# Patient Record
Sex: Female | Born: 1975 | Race: Black or African American | Hispanic: No | Marital: Married | Smoking: Former smoker
Health system: Southern US, Community
[De-identification: ages and names within clinical notes are randomized; demographics above are authoritative.]

## PROBLEM LIST (undated history)

## (undated) DIAGNOSIS — D259 Leiomyoma of uterus, unspecified: Secondary | ICD-10-CM

## (undated) DIAGNOSIS — E7800 Pure hypercholesterolemia, unspecified: Secondary | ICD-10-CM

## (undated) DIAGNOSIS — K219 Gastro-esophageal reflux disease without esophagitis: Secondary | ICD-10-CM

## (undated) DIAGNOSIS — E119 Type 2 diabetes mellitus without complications: Secondary | ICD-10-CM

## (undated) DIAGNOSIS — I10 Essential (primary) hypertension: Secondary | ICD-10-CM

---

## 1999-11-04 ENCOUNTER — Emergency Department (HOSPITAL_COMMUNITY): Admission: EM | Admit: 1999-11-04 | Discharge: 1999-11-04 | Payer: Self-pay | Admitting: Emergency Medicine

## 2013-04-15 ENCOUNTER — Encounter (HOSPITAL_BASED_OUTPATIENT_CLINIC_OR_DEPARTMENT_OTHER): Payer: Self-pay | Admitting: Emergency Medicine

## 2013-04-15 ENCOUNTER — Emergency Department (HOSPITAL_BASED_OUTPATIENT_CLINIC_OR_DEPARTMENT_OTHER): Payer: Self-pay

## 2013-04-15 ENCOUNTER — Emergency Department (HOSPITAL_BASED_OUTPATIENT_CLINIC_OR_DEPARTMENT_OTHER)
Admission: EM | Admit: 2013-04-15 | Discharge: 2013-04-15 | Disposition: A | Discharge status: Home / Self Care | Payer: Self-pay | Attending: Emergency Medicine | Admitting: Emergency Medicine

## 2013-04-15 DIAGNOSIS — E7800 Pure hypercholesterolemia, unspecified: Secondary | ICD-10-CM | POA: Insufficient documentation

## 2013-04-15 DIAGNOSIS — E119 Type 2 diabetes mellitus without complications: Secondary | ICD-10-CM | POA: Insufficient documentation

## 2013-04-15 DIAGNOSIS — Z3202 Encounter for pregnancy test, result negative: Secondary | ICD-10-CM | POA: Insufficient documentation

## 2013-04-15 DIAGNOSIS — R109 Unspecified abdominal pain: Secondary | ICD-10-CM | POA: Insufficient documentation

## 2013-04-15 DIAGNOSIS — D649 Anemia, unspecified: Secondary | ICD-10-CM | POA: Insufficient documentation

## 2013-04-15 DIAGNOSIS — R739 Hyperglycemia, unspecified: Secondary | ICD-10-CM

## 2013-04-15 DIAGNOSIS — Z8719 Personal history of other diseases of the digestive system: Secondary | ICD-10-CM | POA: Insufficient documentation

## 2013-04-15 DIAGNOSIS — Z87891 Personal history of nicotine dependence: Secondary | ICD-10-CM | POA: Insufficient documentation

## 2013-04-15 DIAGNOSIS — Z79899 Other long term (current) drug therapy: Secondary | ICD-10-CM | POA: Insufficient documentation

## 2013-04-15 DIAGNOSIS — I10 Essential (primary) hypertension: Secondary | ICD-10-CM | POA: Insufficient documentation

## 2013-04-15 HISTORY — DX: Type 2 diabetes mellitus without complications: E11.9

## 2013-04-15 HISTORY — DX: Pure hypercholesterolemia, unspecified: E78.00

## 2013-04-15 HISTORY — DX: Gastro-esophageal reflux disease without esophagitis: K21.9

## 2013-04-15 HISTORY — DX: Essential (primary) hypertension: I10

## 2013-04-15 LAB — URINE MICROSCOPIC-ADD ON

## 2013-04-15 LAB — PREGNANCY, URINE: Preg Test, Ur: NEGATIVE

## 2013-04-15 LAB — CBC WITH DIFFERENTIAL/PLATELET
BASOS ABS: 0 10*3/uL (ref 0.0–0.1)
BASOS PCT: 0 % (ref 0–1)
Eosinophils Absolute: 0.6 10*3/uL (ref 0.0–0.7)
Eosinophils Relative: 6 % — ABNORMAL HIGH (ref 0–5)
HCT: 34.1 % — ABNORMAL LOW (ref 36.0–46.0)
Hemoglobin: 11 g/dL — ABNORMAL LOW (ref 12.0–15.0)
Lymphocytes Relative: 39 % (ref 12–46)
Lymphs Abs: 3.6 10*3/uL (ref 0.7–4.0)
MCH: 23.3 pg — ABNORMAL LOW (ref 26.0–34.0)
MCHC: 32.3 g/dL (ref 30.0–36.0)
MCV: 72.2 fL — ABNORMAL LOW (ref 78.0–100.0)
Monocytes Absolute: 0.7 10*3/uL (ref 0.1–1.0)
Monocytes Relative: 7 % (ref 3–12)
Neutro Abs: 4.4 10*3/uL (ref 1.7–7.7)
Neutrophils Relative %: 48 % (ref 43–77)
Platelets: 283 10*3/uL (ref 150–400)
RBC: 4.72 MIL/uL (ref 3.87–5.11)
RDW: 15.4 % (ref 11.5–15.5)
WBC: 9.2 10*3/uL (ref 4.0–10.5)

## 2013-04-15 LAB — BASIC METABOLIC PANEL
BUN: 17 mg/dL (ref 6–23)
CHLORIDE: 100 meq/L (ref 96–112)
CO2: 23 mEq/L (ref 19–32)
Calcium: 9 mg/dL (ref 8.4–10.5)
Creatinine, Ser: 0.7 mg/dL (ref 0.50–1.10)
GFR calc Af Amer: >90 mL/min (ref >90)
GFR calc non Af Amer: >90 mL/min (ref >90)
Glucose, Bld: 245 mg/dL — ABNORMAL HIGH (ref 70–99)
Potassium: 4.3 mEq/L (ref 3.7–5.3)
SODIUM: 137 meq/L (ref 137–147)

## 2013-04-15 LAB — GLUCOSE, CAPILLARY: Glucose-Capillary: 223 mg/dL — ABNORMAL HIGH (ref 70–99)

## 2013-04-15 LAB — URINALYSIS, ROUTINE W REFLEX MICROSCOPIC
Bilirubin Urine: NEGATIVE
GLUCOSE, UA: 500 mg/dL — AB
Ketones, ur: NEGATIVE mg/dL
LEUKOCYTES UA: NEGATIVE
Nitrite: NEGATIVE
PROTEIN: NEGATIVE mg/dL
SPECIFIC GRAVITY, URINE: 1.039 — AB (ref 1.005–1.030)
Urobilinogen, UA: 1 mg/dL (ref 0.0–1.0)
pH: 5.5 (ref 5.0–8.0)

## 2013-04-15 MED ORDER — GLIPIZIDE 10 MG PO TABS: 10.0000 mg | ORAL_TABLET | Freq: Every day | ORAL | Status: DC

## 2013-04-15 MED ORDER — OXYCODONE-ACETAMINOPHEN 5-325 MG PO TABS
2.0000 | ORAL_TABLET | Freq: Once | ORAL | Status: AC
Start: 2013-04-15 — End: 2013-04-15
  Administered 2013-04-15: 2 via ORAL
  Filled 2013-04-15: qty 2

## 2013-04-15 MED ORDER — ATORVASTATIN CALCIUM 10 MG PO TABS: 10.0000 mg | ORAL_TABLET | Freq: Every day | ORAL | Status: DC

## 2013-04-15 MED ORDER — METFORMIN HCL 1000 MG PO TABS: 1000.0000 mg | ORAL_TABLET | Freq: Two times a day (BID) | ORAL | Status: DC

## 2013-04-15 MED ORDER — NAPROXEN 500 MG PO TABS: 500.0000 mg | ORAL_TABLET | Freq: Two times a day (BID) | ORAL | Status: DC

## 2013-04-15 MED ORDER — LISINOPRIL 10 MG PO TABS: 10.0000 mg | ORAL_TABLET | Freq: Every day | ORAL | Status: DC

## 2013-04-15 NOTE — ED Provider Notes (Signed)
CSN: [id number]     Arrival date & time 04/15/13  [id number] History   First MD Initiated Contact with Patient 04/15/13 [phone]     Chief Complaint  Patient presents with  . Flank Pain   (Consider location/radiation/quality/duration/timing/severity/associated sxs/prior Treatment) HPI Comments: Pt is a 38 y/o female with hx of DM and Htn (not taking meds in > month), presents with L sided LUQ and flank pain that radiates into the groin and lower abd.  This has been going on for > one month - is intermittent - comes on spontaneously, not positional, no urinary or stool symtpoms or complaints.  She has not had any post prandial pain - the pain is short lived and described as two bricks rubbing together.  It will then resolve spontaneously.  No prior abd surgery.  Patient is a 38 y.o. female presenting with flank pain. The history is provided by the patient.  Flank Pain    Past Medical History  Diagnosis Date  . Hypertension   . Diabetes mellitus without complication   . Hypercholesteremia   . GERD (gastroesophageal reflux disease)    History reviewed. No pertinent past surgical history. History reviewed. No pertinent family history. History  Substance Use Topics  . Smoking status: Former Smoker    Quit date: 04/14/2013  . Smokeless tobacco: Not on file  . Alcohol Use: No   OB History   Grav Para Term Preterm Abortions TAB SAB Ect Mult Living                 Review of Systems  Genitourinary: Positive for flank pain.  All other systems reviewed and are negative.    Allergies  Bactrim  Home Medications   Current Outpatient Rx  Name  Route  Sig  Dispense  Refill  . atorvastatin (LIPITOR) 10 MG tablet   Oral   Take 10 mg by mouth daily.         Marland Kitchen glipiZIDE (GLUCOTROL) 10 MG tablet   Oral   Take 1 tablet (10 mg total) by mouth daily before breakfast.   30 tablet   2   . lisinopril (PRINIVIL,ZESTRIL) 10 MG tablet   Oral   Take 1 tablet (10 mg total) by mouth daily.   30  tablet   2   . metFORMIN (GLUCOPHAGE) 1000 MG tablet   Oral   Take 1 tablet (1,000 mg total) by mouth 2 (two) times daily with a meal.   60 tablet   2   . naproxen (NAPROSYN) 500 MG tablet   Oral   Take 1 tablet (500 mg total) by mouth 2 (two) times daily with a meal.   30 tablet   0    BP 157/98  Pulse 90  Temp(Src) 98.2 F (36.8 C) (Oral)  Resp 20  Ht 5\' 7"  (1.702 m)  Wt 215 lb (97.523 kg)  BMI 33.67 kg/m2  SpO2 99%  LMP 04/15/2013 Physical Exam  Nursing note and vitals reviewed. Constitutional: She appears well-developed and well-nourished. No distress.  HENT:  Head: Normocephalic and atraumatic.  Mouth/Throat: Oropharynx is clear and moist. No oropharyngeal exudate.  Eyes: Conjunctivae and EOM are normal. Pupils are equal, round, and reactive to light. Right eye exhibits no discharge. Left eye exhibits no discharge. No scleral icterus.  Neck: Normal range of motion. Neck supple. No JVD present. No thyromegaly present.  Cardiovascular: Normal rate, regular rhythm, normal heart sounds and intact distal pulses.  Exam reveals no gallop and no friction rub.  No murmur heard. Pulmonary/Chest: Effort normal and breath sounds normal. No respiratory distress. She has no wheezes. She has no rales.  Abdominal: Soft. Bowel sounds are normal. She exhibits no distension and no mass. There is tenderness ( mild LuQ ttp and mild L CVA ttp.  no guarding, no R sided ttp, no RUQ or epigastric ttp).  Musculoskeletal: Normal range of motion. She exhibits no edema and no tenderness.  Lymphadenopathy:    She has no cervical adenopathy.  Neurological: She is alert. Coordination normal.  Skin: Skin is warm and dry. No rash noted. No erythema.  Psychiatric: She has a normal mood and affect. Her behavior is normal.    ED Course  Procedures (including critical care time) Labs Review Labs Reviewed  URINALYSIS, ROUTINE W REFLEX MICROSCOPIC - Abnormal; Notable for the following:    APPearance  CLOUDY (*)    Specific Gravity, Urine 1.039 (*)    Glucose, UA 500 (*)    Hgb urine dipstick LARGE (*)    All other components within normal limits  CBC WITH DIFFERENTIAL - Abnormal; Notable for the following:    Hemoglobin 11.0 (*)    HCT 34.1 (*)    MCV 72.2 (*)    MCH 23.3 (*)    Eosinophils Relative 6 (*)    All other components within normal limits  BASIC METABOLIC PANEL - Abnormal; Notable for the following:    Glucose, Bld 245 (*)    All other components within normal limits  URINE MICROSCOPIC-ADD ON - Abnormal; Notable for the following:    Squamous Epithelial / LPF FEW (*)    Bacteria, UA FEW (*)    Casts HYALINE CASTS (*)    All other components within normal limits  GLUCOSE, CAPILLARY - Abnormal; Notable for the following:    Glucose-Capillary 223 (*)    All other components within normal limits  PREGNANCY, URINE   Imaging Review Ct Abdomen Pelvis Wo Contrast  04/15/2013   CLINICAL DATA:  Left flank pain.  EXAM: CT ABDOMEN AND PELVIS WITHOUT CONTRAST  TECHNIQUE: Multidetector CT imaging of the abdomen and pelvis was performed following the standard protocol without intravenous contrast.  COMPARISON:  None.  FINDINGS: BODY WALL: Unremarkable.  LOWER CHEST: Unremarkable.  ABDOMEN/PELVIS:  Liver: Diffuse fatty infiltration of the liver. The liver is prominent in size, 18 cm in maximal craniocaudal span. Stone  Biliary: No evidence of biliary obstruction or stone.  Pancreas: Unremarkable.  Spleen: Unremarkable.  Adrenals: Unremarkable.  Kidneys and ureters: No hydronephrosis or stone.  Bladder: Unremarkable.  Reproductive: Unremarkable.  Bowel: No obstruction. Normal appendix.  Retroperitoneum: Mildly prominent periaortic lymph nodes, not definitively pathologic.  Peritoneum: No free fluid or gas.  Vascular: No acute abnormality.  OSSEOUS: Early degenerative change to the SI joints with small spurs. Mild lower lumbar facet osteoarthritis. There is disc bulging at L5-S1 which effaces  the inferior foramina.  IMPRESSION: 1. No hydronephrosis or nephrolithiasis. 2. Fatty liver.   Electronically Signed   By: Jorje Guild M.D.   On: 04/15/2013 05:29    EKG Interpretation   None       MDM   1. Abdominal pain   2. Hyperglycemia   3. Hypertension   4. Mild anemia    The pt's VS show hypertension - check CBG, UA and preg - possible KS, diverticulitis, pyelo though less likely given intermittent sx.  Pain meds ordered.  States she rarely gets nauseated or diaphoretic.  She claims that she is on her  menstrual cycle at this time.  Urinalysis shows hematuria, expected given her menstrual cycle, no signs of infection. Labs show mild anemia but no leukocytosis, normal renal function, slight hyperglycemia. The patient endorses not taking her medications as prescribed explaining her slight hypertension and hyperglycemia. No signs of DKA, CT scan shows no signs of intra-abdominal pathology of concern today. The patient was informed of all of these results and encouraged to follow very closely, she expresses her understanding and acknowledges the indications for return.  Rx:  Glipizide Metformin Lisinopril Naprosyn Lipitor  Johnna Acosta, MD 04/15/13 [phone]

## 2013-04-15 NOTE — ED Notes (Signed)
Pt reports left flank pain that radiates to her back, pt reports pain has been off/on for 2 weeks, tonight pain became the most intense she has felt

## 2013-04-15 NOTE — ED Notes (Signed)
Pt reports that she has been unable to get her glypizide, metformin, or htn meds for > 1-2 months, pt has not followed her blood sugars for months either due to recent move and lifestyle change

## 2013-04-15 NOTE — Discharge Instructions (Signed)
Your testing was normal though it did show that you have elevated blood sugar levels and high blood pressure.  Your CT scan did not show any surgical problems, kidney stones, infections, abscesses or other concerning findings.  You MUST follow up this next week for recheck.  Meds given in ED:   Please take the medicines that I have Rx inlcuding your diabetic medicine and your blood pressure medicines.   Emergency Department Resource Guide 1) Find a Doctor and Pay Out of Pocket Although you won't have to find out who is covered by your insurance plan, it is a good idea to ask around and get recommendations. You will then need to call the office and see if the doctor you have chosen will accept you as a new patient and what types of options they offer for patients who are self-pay. Some doctors offer discounts or will set up payment plans for their patients who do not have insurance, but you will need to ask so you aren't surprised when you get to your appointment.  2) Contact Your Local Health Department Not all health departments have doctors that can see patients for sick visits, but many do, so it is worth a call to see if yours does. If you don't know where your local health department is, you can check in your phone book. The CDC also has a tool to help you locate your state's health department, and many state websites also have listings of all of their local health departments.  3) Find a [facility] If your illness is not likely to be very severe or complicated, you may want to try a walk in clinic. These are popping up all over the country in pharmacies, drugstores, and shopping centers. They're usually staffed by nurse practitioners or physician assistants that have been trained to treat common illnesses and complaints. They're usually fairly quick and inexpensive. However, if you have serious medical issues or chronic medical problems, these are probably not your best option.  No  Primary Care Doctor: - Call Health Connect at  [phone] - they can help you locate a primary care doctor that  accepts your insurance, provides certain services, etc. - Physician Referral Service- [phone]  Chronic Pain Problems: Organization         Address  Phone   Notes  [facility]  [phone] Patients need to be referred by their primary care doctor.   Medication Assistance: Organization         Address  Phone   Notes  [facility] Medication [facility] [address] [place], [place] [address] [phone] --Must be a resident of [facility] -- Must have NO insurance coverage whatsoever (no Medicaid/ Medicare, etc.) -- The pt. MUST have a primary care doctor that directs their care regularly and follows them in the community   [facility]ist  [phone]   [employer]  [phone]    Agencies that provide inexpensive medical care: Organization         Address  Phone   Notes  [place]  [phone]   [facility]    [phone]   [facility] [place], [place] [address] [phone]   [place] [phone]   Planned Parenthood    [phone]   [facility]    [phone]   Community Health and  [address], [place] Phone:  [phone], Fax:  [phone] Hours of Operation:  9 am - 6 pm, M-F.  Also accepts Medicaid/Medicare and self-pay.  [facility] for [address], [address], [place] Phone: [phone], Fax: [phone]. Hours of Operation:  8:30 am - 5:30 pm, M-F.  Also accepts Medicaid and self-pay.  [facility] [place] [address] [place] Phone: [phone]   [place], [place], [place] [phone], Ext. [phone] Mondays & Thursdays: 7-9 AM.  First 15 patients are seen on  a first come, first serve basis.    [facility] Providers:  Organization         Address  Phone   Notes  [facility] [address], [place] [phone] Also accepts self-pay patients.  [facility] [address], [place]  [phone]   [place], [address], [place] [phone]   [facility] [address] [place] [phone]   Lucianne Lei [address], [place]   [phone] Only accepts [place] Access [place] patients after they have their name applied to their card.   Self-Pay (no insurance) in [facility]:  Organization         Address  Phone   Notes  Sickle Cell Patients, [facility] Internal Medicine [place] [phone]   [facility]gent Care [place] [phone]   [facility] [place]  [place], [place], [place] [phone]   [facility]/Dr. Osei-Bonsu  [address] [place] or [address], [place] [phone] Phone number for both [place] and [place] locations is the same.  Urgent Medical and [facility] [address] [place] [phone]   [facility] [address] [place] or [address] [phone]   [facility] [address] [place] [phone], phone; [phone], fax Sees patients 1st and 3rd Saturday of every month.  Must not qualify for public or private insurance (i.e. Medicaid, Medicare, [place] Health Choice, Veterans' Benefits)  Household income should be no more than 200% of the poverty level The clinic cannot treat you if you are pregnant or think you are pregnant  Sexually transmitted diseases are not treated at the clinic.    Dental Care: Organization          Address  Phone  Notes  [facility] Department of [facility] [place] [phone] Accepts children up to age 37 who are enrolled in [place] or [place]; pregnant women with a Medicaid card; and children who have applied for Medicaid or [place] Health Choice, but were declined, whose parents can pay a reduced fee at time of service.  [facility] Department of [facility]  [address], [place] [phone] Accepts children up to age 25 who are enrolled in [place] or [place]; pregnant women with a Medicaid card; and children who have applied for Medicaid or [place] Health Choice, but were declined, whose parents can pay a reduced fee at time of service.  [facility] Adult [facility] Access PROGRAM  [place],  [place] [phone] Patients are seen by appointment only. Walk-ins are not accepted. [place] will see patients 31 years of age and older. Monday - Tuesday (8am-5pm) Most Wednesdays (8:30-5pm) $30 per visit, cash only  [facility] Adult Dental Access PROGRAM  [address], [facility] [phone] Patients are seen by appointment only. Walk-ins are not accepted. [place] will see patients 57 years of age and older. One Wednesday Evening (Monthly: Volunteer Based).  $30 per visit, cash only  [place]  [phone] for adults; Children under age 86, call Graduate Pediatric Dentistry at [phone]. Children aged 33-14, please call [phone] to request a pediatric application.  Dental services are provided in all areas of dental care including fillings, crowns and bridges, complete and partial dentures, implants, gum treatment, root canals, and extractions. Preventive care is also provided. Treatment is provided to both adults and children. Patients are selected via a lottery and there is often a waiting list.   [facility] [address] [place]  [phone] [email or web address]   [facility] [address] [place], [place] [phone], Ext. [phone] Second and Fourth Thursday of each month, opens at 6:30 AM; Clinic ends at 9 AM.  Patients are seen on a first-come first-served basis, and a limited number are seen during each clinic.   [facility]  [address] Hillard Danker [place], [place] [phone]   Eligibility Requirements You must have lived in [place], [place], or [place] counties for at least the last three months.   You cannot be eligible for state or federal sponsored [employer], including [employer], [place], or [employer].   You generally cannot be eligible for healthcare insurance through your employer.    How to apply: Eligibility screenings are held every Tuesday and Wednesday afternoon from 1:00 pm until 4:00 pm. You do not need an appointment for the interview!  [facility] [address] [place], [place]   [place] Department  [place]  [phone]    Behavioral Health Resources in the Community: Intensive Outpatient Programs Organization         Address  Phone  Notes  [place]. [address] [place], [place] [phone]   [facility] Outpatient [address] [place], [place]   ADS: [facility] Drug [facility] [address] [place], [place]   [place] [address],  [place], [place] or [phone]   Substance Abuse Resources Organization         Address  Phone  Notes  Alcohol and Drug Services  [phone]   [place]  [phone]   [facility] [place]   Chinita Pester  [phone]   Residential & Outpatient Substance Abuse Program  [phone]   Psychological  Services Organization         Address  Phone  Notes  [facility] [place]  [place]  [phone]   [place] [address], [place] or [phone]    Mobile Crisis Teams Organization         Address  Phone  Notes  [facility], Mobile Crisis Care Unit  [phone]   Assertive Psychotherapeutic Services  [address] [place] [address] [place], [place]   [place] [phone]  [address] [place] [phone]    Self-Help/Support Groups Organization         Address  Phone             Notes  Mental Health Assoc. of [place] - variety of support groups  [place] Call for more information  Narcotics Anonymous (NA), [facility] [address], [employer] [place]  2 meetings at this location   Special educational needs teacher         Address  Phone  Notes  [facility] [place],    [place]  [phone]   [facility]  [address] [place] [address], [place], [place]   [place], [place] [phone] Admissions: 8am-3pm M-F  Incentives Substance [place] [address],    [place], [place] [phone]   [facility] [address] [place], [place], [place]   [facility] [address],  [place], [place]   [facility] Programs - Intensive Outpatient [address], [place], [place]   [facility] ([place].,  [place], [place] [phone] or [phone]   [facility] Treatment Services (RTS) [address] [place], [place] Accepts Medicaid  [facility] [place] [address]  [place] [phone] Substance Abuse/Addiction Treatment   [facility] Organization         Address  Phone  Notes  [facility]  [phone]   Domenic Schwab, PhD [address] Arlis Porta [place], [place]   [phone] or [phone]   [place], [place] [phone]   [facility] [address] [place], [place] [phone] Insurance/Medicaid/sponsorship through [facility] and Families [address], [address] [place]                                    [place], [place] [phone] [place] [address], [place] [phone]    Dr. Adele Schilder  [phone]   [facility] Dept. 1) [address] [place] 2) [place] 3)  [place] [address], [place] [phone]  [phone]   [place] [phone] or [phone] (After Hours)

## 2013-04-19 ENCOUNTER — Encounter: Payer: Self-pay | Admitting: Internal Medicine

## 2013-04-19 ENCOUNTER — Other Ambulatory Visit: Payer: Self-pay | Admitting: Internal Medicine

## 2013-04-19 ENCOUNTER — Ambulatory Visit (INDEPENDENT_AMBULATORY_CARE_PROVIDER_SITE_OTHER): Payer: BC Managed Care – PPO | Admitting: Internal Medicine

## 2013-04-19 VITALS — BP 137/93 | HR 85 | Resp 16 | Ht 67.0 in | Wt 237.0 lb

## 2013-04-19 DIAGNOSIS — R51 Headache: Secondary | ICD-10-CM

## 2013-04-19 DIAGNOSIS — I10 Essential (primary) hypertension: Secondary | ICD-10-CM

## 2013-04-19 DIAGNOSIS — E1165 Type 2 diabetes mellitus with hyperglycemia: Secondary | ICD-10-CM

## 2013-04-19 DIAGNOSIS — E119 Type 2 diabetes mellitus without complications: Secondary | ICD-10-CM

## 2013-04-19 DIAGNOSIS — IMO0001 Reserved for inherently not codable concepts without codable children: Secondary | ICD-10-CM

## 2013-04-19 DIAGNOSIS — R102 Pelvic and perineal pain: Secondary | ICD-10-CM

## 2013-04-19 DIAGNOSIS — N920 Excessive and frequent menstruation with regular cycle: Secondary | ICD-10-CM

## 2013-04-19 DIAGNOSIS — N949 Unspecified condition associated with female genital organs and menstrual cycle: Secondary | ICD-10-CM

## 2013-04-19 DIAGNOSIS — E785 Hyperlipidemia, unspecified: Secondary | ICD-10-CM

## 2013-04-19 DIAGNOSIS — R519 Headache, unspecified: Secondary | ICD-10-CM | POA: Insufficient documentation

## 2013-04-19 LAB — CBC WITH DIFFERENTIAL/PLATELET
BASOS ABS: 0 10*3/uL (ref 0.0–0.1)
Basophils Relative: 0 % (ref 0–1)
EOS PCT: 6 % — AB (ref 0–5)
Eosinophils Absolute: 0.4 10*3/uL (ref 0.0–0.7)
HEMATOCRIT: 35.1 % — AB (ref 36.0–46.0)
Hemoglobin: 11.3 g/dL — ABNORMAL LOW (ref 12.0–15.0)
Lymphocytes Relative: 39 % (ref 12–46)
Lymphs Abs: 2.6 10*3/uL (ref 0.7–4.0)
MCH: 23 pg — ABNORMAL LOW (ref 26.0–34.0)
MCHC: 32.2 g/dL (ref 30.0–36.0)
MCV: 71.5 fL — AB (ref 78.0–100.0)
MONO ABS: 0.4 10*3/uL (ref 0.1–1.0)
Monocytes Relative: 6 % (ref 3–12)
Neutro Abs: 3.3 10*3/uL (ref 1.7–7.7)
Neutrophils Relative %: 49 % (ref 43–77)
Platelets: 344 10*3/uL (ref 150–400)
RBC: 4.91 MIL/uL (ref 3.87–5.11)
RDW: 16.5 % — AB (ref 11.5–15.5)
WBC: 6.8 10*3/uL (ref 4.0–10.5)

## 2013-04-19 LAB — COMPREHENSIVE METABOLIC PANEL
ALT: 15 U/L (ref 0–35)
AST: 12 U/L (ref 0–37)
Albumin: 4 g/dL (ref 3.5–5.2)
Alkaline Phosphatase: 59 U/L (ref 39–117)
BUN: 13 mg/dL (ref 6–23)
CALCIUM: 9.2 mg/dL (ref 8.4–10.5)
CHLORIDE: 102 meq/L (ref 96–112)
CO2: 26 mEq/L (ref 19–32)
CREATININE: 0.68 mg/dL (ref 0.50–1.10)
Glucose, Bld: 208 mg/dL — ABNORMAL HIGH (ref 70–99)
Potassium: 4.6 mEq/L (ref 3.5–5.3)
Sodium: 138 mEq/L (ref 135–145)
Total Bilirubin: 0.2 mg/dL — ABNORMAL LOW (ref 0.3–1.2)
Total Protein: 6.7 g/dL (ref 6.0–8.3)

## 2013-04-19 LAB — LIPID PANEL
CHOL/HDL RATIO: 5.8 ratio
CHOLESTEROL: 198 mg/dL (ref 0–200)
HDL: 34 mg/dL — AB (ref >39)
TRIGLYCERIDES: 454 mg/dL — AB (ref <150)

## 2013-04-19 LAB — TSH: TSH: 0.437 u[IU]/mL (ref 0.350–4.500)

## 2013-04-19 LAB — HEMOGLOBIN A1C
Hgb A1c MFr Bld: 9 % — ABNORMAL HIGH (ref <5.7)
Mean Plasma Glucose: 212 mg/dL — ABNORMAL HIGH (ref <117)

## 2013-04-19 MED ORDER — LISINOPRIL-HYDROCHLOROTHIAZIDE 10-12.5 MG PO TABS: 1.0000 | ORAL_TABLET | Freq: Every day | ORAL | Status: DC

## 2013-04-19 MED ORDER — GLIPIZIDE 10 MG PO TABS: 10.0000 mg | ORAL_TABLET | Freq: Every day | ORAL | Status: DC

## 2013-04-19 NOTE — Patient Instructions (Signed)
referra l to GYN for bleeding and diabetes center new diabetes  See me in 2 weeks  30 min

## 2013-04-19 NOTE — Progress Notes (Signed)
   Subjective:    Patient ID: Danielle Mendez, female    DOB: [birth year], 38 y.o.   MRN: [id number]  HPI Danielle Mendez is a new pt here for primary care. Without insurance for many years.   Formerly in [place]  .  She is now working part-time at [employer] of Type II diabetes, HTN, Hyperlipidemia,    Headaches  (told in ER they were migraines).  She has been out of all meds for the past 3-4 months.  She cannot tolerate Metformin due to nausea  She has increased thirst,  No polyuria, no polyphagia .  FBS in office 260.    She also notes heavy menstrual bleeding.  She is G3P3AB0.   Lots of clots  Duration 7-8 days.   Just came of menses yesterday She has had Left lower pelvic pain for over 1 year.    No dysuria,  No N/V/D    Review of Systems See HPI    Objective:   Physical Exam Physical Exam  Nursing note and vitals reviewed.  Constitutional: She is oriented to person, place, and time. She appears well-developed and well-nourished.  HENT:  Head: Normocephalic and atraumatic.  Cardiovascular: Normal rate and regular rhythm. Exam reveals no gallop and no friction rub.  No murmur heard.  Pulmonary/Chest: Breath sounds normal. She has no wheezes. She has no rales.  Abd  BS +  Soft  No HSM  Minimal tenderness LLQ no rebound no peritoneal signs Neurological: She is alert and oriented to person, place, and time.  Skin: Skin is warm and dry.  Psychiatric: She has a normal mood and affect. Her behavior is normal.          Assessment & Plan:  Uncontrolled diabetes:   FBS 260 in office  Will restart Glipizide,  Refer diabetes education.  I gave RX for glucometer and advised to test glucose bid and bring next visit.   Reviewed S/S of hypoglycemia and how to treat.    See me in 2 weeks  HTN    Will restart Lisinopril/HCTZ 12.5    Menorrhagia   Will check CBC with TSH and complete chem panel.  Will refer to GYN for further eval and possibly ultrasound for pelvic  discomfort  Hyperlipidemia  Will check today  Chronic pelvic discomfort  See above  Headache.    See me in two weeks.

## 2013-04-20 ENCOUNTER — Ambulatory Visit: Payer: Self-pay | Admitting: Internal Medicine

## 2013-04-20 LAB — VITAMIN D 25 HYDROXY (VIT D DEFICIENCY, FRACTURES): Vit D, 25-Hydroxy: 19 ng/mL — ABNORMAL LOW (ref 30–89)

## 2013-04-21 ENCOUNTER — Encounter: Payer: Self-pay | Admitting: *Deleted

## 2013-04-22 ENCOUNTER — Encounter: Payer: Self-pay | Admitting: *Deleted

## 2013-04-25 ENCOUNTER — Telehealth: Payer: Self-pay | Admitting: *Deleted

## 2013-04-25 NOTE — Telephone Encounter (Signed)
Message copied by Conley Rolls on Mon Apr 25, 2013  9:28 AM ------      Message from: Emi Belfast D      Created: Mon Apr 25, 2013  8:00 AM       Jolayne Haines            Call pt and let her know that she is slightly anemic - will discuss further at next visit  .  Advise her to keep appt with me on the 27th ------

## 2013-04-25 NOTE — Telephone Encounter (Signed)
Notified pt of lab results. Pt will keep F/U appt on 27th

## 2013-05-03 ENCOUNTER — Encounter: Payer: Self-pay | Admitting: Internal Medicine

## 2013-05-03 ENCOUNTER — Ambulatory Visit (INDEPENDENT_AMBULATORY_CARE_PROVIDER_SITE_OTHER): Payer: BC Managed Care – PPO | Admitting: Internal Medicine

## 2013-05-03 VITALS — BP 118/74 | HR 87 | Temp 98.0°F | Ht 67.5 in | Wt 238.2 lb

## 2013-05-03 DIAGNOSIS — E785 Hyperlipidemia, unspecified: Secondary | ICD-10-CM

## 2013-05-03 DIAGNOSIS — E1165 Type 2 diabetes mellitus with hyperglycemia: Secondary | ICD-10-CM

## 2013-05-03 DIAGNOSIS — N920 Excessive and frequent menstruation with regular cycle: Secondary | ICD-10-CM

## 2013-05-03 DIAGNOSIS — IMO0001 Reserved for inherently not codable concepts without codable children: Secondary | ICD-10-CM

## 2013-05-03 DIAGNOSIS — I10 Essential (primary) hypertension: Secondary | ICD-10-CM

## 2013-05-03 DIAGNOSIS — R102 Pelvic and perineal pain unspecified side: Secondary | ICD-10-CM

## 2013-05-03 DIAGNOSIS — Z23 Encounter for immunization: Secondary | ICD-10-CM

## 2013-05-03 MED ORDER — INTEGRA 62.5-62.5-40-3 MG PO CAPS: ORAL_CAPSULE | ORAL | Status: DC

## 2013-05-03 MED ORDER — ERGOCALCIFEROL 1.25 MG (50000 UT) PO CAPS: 50000.0000 [IU] | ORAL_CAPSULE | ORAL | Status: DC

## 2013-05-03 MED ORDER — LISINOPRIL 10 MG PO TABS: 10.0000 mg | ORAL_TABLET | Freq: Every day | ORAL | Status: DC

## 2013-05-03 NOTE — Progress Notes (Signed)
Subjective:    Patient ID: Danielle Mendez, female    DOB: 13-May-1975, 38 y.o.   MRN: [id number]  HPI Danielle Mendez is here for follow up after being out of her htn and diabetes meds for several months.  She has been taking lisinopril 10 mg daily (RX from ER)  And she has been taking 10 m of glipizide and metformin 1000 mg bid.  She reports metformin is making her nauseated and she has diarrhea.   She did not bring home glucoses with her but tells me they have been running  "120-130"  She has not started diabetes education classes as yet  See lipids and vitamin D.  She would like to try something OTC for her elevated Tg's    See HGB.   She has pending appt with Dr. Leo Mendez in early February.    Allergies  Allergen Reactions  . Bactrim [Sulfamethoxazole-Tmp Ds]    Past Medical History  Diagnosis Date  . Hypertension   . Diabetes mellitus without complication   . Hypercholesteremia   . GERD (gastroesophageal reflux disease)    No past surgical history on file. History   Social History  . Marital Status: Single    Spouse Name: N/A    Number of Children: N/A  . Years of Education: N/A   Occupational History  . Not on file.   Social History Main Topics  . Smoking status: Former Smoker    Quit date: 04/14/2013  . Smokeless tobacco: Not on file  . Alcohol Use: No  . Drug Use: No  . Sexual Activity: Yes    Birth Control/ Protection: None   Other Topics Concern  . Not on file   Social History Narrative  . No narrative on file   No family history on file. Patient Active Problem List   Diagnosis Date Noted  . Type II or unspecified type diabetes mellitus without mention of complication, uncontrolled 04/19/2013  . HTN (hypertension) 04/19/2013  . Hyperlipidemia 04/19/2013  . Headache(784.0) 04/19/2013  . Menorrhagia 04/19/2013  . Pelvic pain 04/19/2013   Current Outpatient Prescriptions on File Prior to Visit  Medication Sig Dispense Refill  . albuterol (PROVENTIL  HFA;VENTOLIN HFA) 108 (90 BASE) MCG/ACT inhaler Inhale 2 puffs into the lungs every 6 (six) hours as needed for wheezing or shortness of breath.      Marland Kitchen atorvastatin (LIPITOR) 10 MG tablet Take 10 mg by mouth daily.      Marland Kitchen gabapentin (NEURONTIN) 300 MG capsule Take 300 mg by mouth 3 (three) times daily.      Marland Kitchen glipiZIDE (GLUCOTROL) 10 MG tablet Take 1 tablet (10 mg total) by mouth daily before breakfast.  90 tablet  0  . pantoprazole (PROTONIX) 20 MG tablet Take 20 mg by mouth daily.      Marland Kitchen lisinopril-hydrochlorothiazide (PRINZIDE,ZESTORETIC) 10-12.5 MG per tablet Take 1 tablet by mouth daily.  90 tablet  0   No current facility-administered medications on file prior to visit.       Review of Systems See HPI    Objective:   Physical Exam Physical Exam  Nursing note and vitals reviewed.  Constitutional: She is oriented to person, place, and time. She appears well-developed and well-nourished.  HENT:  Head: Normocephalic and atraumatic.  Cardiovascular: Normal rate and regular rhythm. Exam reveals no gallop and no friction rub.  No murmur heard.  Pulmonary/Chest: Breath sounds normal. She has no wheezes. She has no rales.  Neurological: She is alert and oriented to  person, place, and time.  Skin: Skin is warm and dry.  Psychiatric: She has a normal mood and affect. Her behavior is normal.             Assessment & Plan:  Diabetes  Will stop metformin due to GI intolerance.  Advised to take glipizide 10 mg in am and 1/2 tab at dinner.  She is to check glucose fasting and before dinner and bring to me next visit.  Diabetes education pending  Anemia  Will start Integra daily  GYN eval pending  HTN  OK to take lisinopril 10 mg only  Hypertriglyceridemia  She does not want a statin now.  Advised to take fish oil 2 gms daily  Vitamin D deficiency  RX 50,000 units weekly for 8 weeks  Flu vaccine given today  See me in March for CPE

## 2013-05-03 NOTE — Patient Instructions (Signed)
Keep next appt. With me

## 2013-05-16 ENCOUNTER — Ambulatory Visit: Payer: Self-pay | Admitting: Internal Medicine

## 2013-05-25 ENCOUNTER — Other Ambulatory Visit: Payer: Self-pay | Admitting: *Deleted

## 2013-05-25 NOTE — Telephone Encounter (Signed)
Refill request

## 2013-05-26 NOTE — Telephone Encounter (Signed)
Danielle Mendez  Pt was to see me in office on 1/27 for follow up and bring record of her blood glucose.    I did not refill med -  She is to see  Me in office  Give 30 min appt.  Bring record of glucoses with her

## 2013-05-27 ENCOUNTER — Other Ambulatory Visit: Payer: Self-pay | Admitting: *Deleted

## 2013-06-14 ENCOUNTER — Encounter: Payer: Self-pay | Admitting: Internal Medicine

## 2013-06-27 ENCOUNTER — Encounter: Payer: Self-pay | Admitting: Internal Medicine

## 2013-06-27 DIAGNOSIS — Z0000 Encounter for general adult medical examination without abnormal findings: Secondary | ICD-10-CM

## 2013-08-24 ENCOUNTER — Emergency Department (HOSPITAL_BASED_OUTPATIENT_CLINIC_OR_DEPARTMENT_OTHER)
Admission: EM | Admit: 2013-08-24 | Discharge: 2013-08-24 | Disposition: A | Discharge status: Home / Self Care | Payer: BC Managed Care – PPO | Attending: Emergency Medicine | Admitting: Emergency Medicine

## 2013-08-24 ENCOUNTER — Encounter (HOSPITAL_BASED_OUTPATIENT_CLINIC_OR_DEPARTMENT_OTHER): Payer: Self-pay | Admitting: Emergency Medicine

## 2013-08-24 ENCOUNTER — Emergency Department (HOSPITAL_BASED_OUTPATIENT_CLINIC_OR_DEPARTMENT_OTHER): Payer: BC Managed Care – PPO

## 2013-08-24 DIAGNOSIS — E785 Hyperlipidemia, unspecified: Secondary | ICD-10-CM | POA: Insufficient documentation

## 2013-08-24 DIAGNOSIS — K219 Gastro-esophageal reflux disease without esophagitis: Secondary | ICD-10-CM | POA: Insufficient documentation

## 2013-08-24 DIAGNOSIS — Z87891 Personal history of nicotine dependence: Secondary | ICD-10-CM | POA: Insufficient documentation

## 2013-08-24 DIAGNOSIS — Z3202 Encounter for pregnancy test, result negative: Secondary | ICD-10-CM | POA: Insufficient documentation

## 2013-08-24 DIAGNOSIS — Z79899 Other long term (current) drug therapy: Secondary | ICD-10-CM | POA: Insufficient documentation

## 2013-08-24 DIAGNOSIS — E119 Type 2 diabetes mellitus without complications: Secondary | ICD-10-CM | POA: Insufficient documentation

## 2013-08-24 DIAGNOSIS — J069 Acute upper respiratory infection, unspecified: Secondary | ICD-10-CM | POA: Insufficient documentation

## 2013-08-24 DIAGNOSIS — I10 Essential (primary) hypertension: Secondary | ICD-10-CM | POA: Insufficient documentation

## 2013-08-24 LAB — URINALYSIS, ROUTINE W REFLEX MICROSCOPIC
Bilirubin Urine: NEGATIVE
Glucose, UA: 500 mg/dL — AB
Hgb urine dipstick: NEGATIVE
Ketones, ur: NEGATIVE mg/dL
Nitrite: NEGATIVE
PH: 5.5 (ref 5.0–8.0)
Protein, ur: NEGATIVE mg/dL
Specific Gravity, Urine: 1.024 (ref 1.005–1.030)
Urobilinogen, UA: 0.2 mg/dL (ref 0.0–1.0)

## 2013-08-24 LAB — URINE MICROSCOPIC-ADD ON

## 2013-08-24 LAB — PREGNANCY, URINE: Preg Test, Ur: NEGATIVE

## 2013-08-24 MED ORDER — CETIRIZINE HCL 10 MG PO TABS: 10.0000 mg | ORAL_TABLET | Freq: Every day | ORAL | Status: DC

## 2013-08-24 MED ORDER — IBUPROFEN 800 MG PO TABS
ORAL_TABLET | ORAL | Status: AC
  Filled 2013-08-24: qty 1

## 2013-08-24 MED ORDER — IBUPROFEN 800 MG PO TABS
800.0000 mg | ORAL_TABLET | Freq: Once | ORAL | Status: AC
  Administered 2013-08-24: 800 mg via ORAL

## 2013-08-24 MED ORDER — IBUPROFEN 800 MG PO TABS: 800.0000 mg | ORAL_TABLET | Freq: Three times a day (TID) | ORAL | Status: DC

## 2013-08-24 MED ORDER — FLUTICASONE PROPIONATE 50 MCG/ACT NA SUSP: 2.0000 | Freq: Every day | NASAL | Status: DC

## 2013-08-24 NOTE — ED Notes (Signed)
Cough, congestion, and generalized body aches

## 2013-08-24 NOTE — ED Provider Notes (Signed)
CSN: [id number]     Arrival date & time 08/24/13  0205 History   First MD Initiated Contact with Patient 08/24/13 0246     Chief Complaint  Patient presents with  . Cough     (Consider location/radiation/quality/duration/timing/severity/associated sxs/prior Treatment) Patient is a 38 y.o. female presenting with URI. The history is provided by the patient. No language interpreter was used.  URI Presenting symptoms: congestion and cough   Presenting symptoms: no ear pain and no fever   Congestion:    Location:  Nasal   Interferes with sleep: no     Interferes with eating/drinking: no   Cough:    Cough characteristics:  Non-productive   Severity:  Moderate   Onset quality:  Gradual   Timing:  Sporadic   Progression:  Unchanged   Chronicity:  New Severity:  Moderate Onset quality:  Gradual Timing:  Constant Progression:  Unchanged Chronicity:  New Relieved by:  Nothing Worsened by:  Nothing tried Ineffective treatments: nyquil. Associated symptoms: no myalgias, no neck pain, no swollen glands and no wheezing   Risk factors: not elderly and no sick contacts     Past Medical History  Diagnosis Date  . Hypertension   . Diabetes mellitus without complication   . Hypercholesteremia   . GERD (gastroesophageal reflux disease)    History reviewed. No pertinent past surgical history. History reviewed. No pertinent family history. History  Substance Use Topics  . Smoking status: Former Smoker    Quit date: 04/14/2013  . Smokeless tobacco: Not on file  . Alcohol Use: No   OB History   Grav Para Term Preterm Abortions TAB SAB Ect Mult Living                 Review of Systems  Constitutional: Negative for fever.  HENT: Positive for congestion. Negative for drooling and ear pain.   Respiratory: Positive for cough. Negative for wheezing.   Musculoskeletal: Negative for myalgias and neck pain.  All other systems reviewed and are negative.     Allergies   Bactrim  Home Medications   Prior to Admission medications   Medication Sig Start Date End Date Taking? Authorizing Provider  atorvastatin (LIPITOR) 10 MG tablet Take 10 mg by mouth daily.   Yes Historical Provider, MD  ergocalciferol (VITAMIN D2) 50000 UNITS capsule Take 1 capsule (50,000 Units total) by mouth once a week. 05/03/13  Yes Lanice Shirts, MD  Fe Fum-FePoly-Vit C-Vit B3 (INTEGRA) 62.5-62.5-40-3 MG CAPS Take one daily  Give generic 05/03/13  Yes Lanice Shirts, MD  gabapentin (NEURONTIN) 300 MG capsule Take 300 mg by mouth 3 (three) times daily.   Yes Historical Provider, MD  glipiZIDE (GLUCOTROL) 10 MG tablet Take 1 tablet (10 mg total) by mouth daily before breakfast. 04/19/13  Yes Lanice Shirts, MD  lisinopril (PRINIVIL,ZESTRIL) 10 MG tablet Take 1 tablet (10 mg total) by mouth daily. 05/03/13  Yes Lanice Shirts, MD  metFORMIN (GLUMETZA) 1000 MG (MOD) 24 hr tablet Take 1,000 mg by mouth daily with breakfast.   Yes Historical Provider, MD  pantoprazole (PROTONIX) 20 MG tablet Take 20 mg by mouth daily.   Yes Historical Provider, MD  albuterol (PROVENTIL HFA;VENTOLIN HFA) 108 (90 BASE) MCG/ACT inhaler Inhale 2 puffs into the lungs every 6 (six) hours as needed for wheezing or shortness of breath.    Historical Provider, MD  naproxen (NAPROSYN) 500 MG tablet  04/21/13   Historical Provider, MD   BP 150/103  Pulse  106  Temp(Src) 98.6 F (37 C) (Oral)  Resp 20  SpO2 98%  LMP 07/08/2013 Physical Exam  Constitutional: She is oriented to person, place, and time. She appears well-developed and well-nourished. No distress.  HENT:  Head: Normocephalic and atraumatic.  Mouth/Throat: Oropharynx is clear and moist.  Eyes: Conjunctivae and EOM are normal. Pupils are equal, round, and reactive to light.  Neck: Normal range of motion. Neck supple.  Cardiovascular: Normal rate, regular rhythm and intact distal pulses.   Pulmonary/Chest: Effort normal and breath  sounds normal. No stridor. She has no wheezes. She has no rales.  Abdominal: Soft. Bowel sounds are normal. There is no tenderness. There is no rebound and no guarding.  Musculoskeletal: Normal range of motion.  Lymphadenopathy:    She has no cervical adenopathy.  Neurological: She is alert and oriented to person, place, and time.  Skin: Skin is warm and dry.  Psychiatric: She has a normal mood and affect.    ED Course  Procedures (including critical care time) Labs Review Labs Reviewed  URINALYSIS, ROUTINE W REFLEX MICROSCOPIC - Abnormal; Notable for the following:    Glucose, UA 500 (*)    Leukocytes, UA TRACE (*)    All other components within normal limits  URINE MICROSCOPIC-ADD ON - Abnormal; Notable for the following:    Squamous Epithelial / LPF FEW (*)    Bacteria, UA FEW (*)    All other components within normal limits  PREGNANCY, URINE    Imaging Review No results found.   EKG Interpretation None      MDM   Final diagnoses:  None    URI will treat with ibuprofen for myalgias, zyrtec and flonase for congestion.  Follow up with your doctor for recheck in 3 days    Giavonni Fonder Alfonso Patten, MD 08/24/13 [phone]

## 2013-08-24 NOTE — Discharge Instructions (Signed)
Cough, Adult  A cough is a reflex. It helps you clear your throat and airways. A cough can help heal your body. A cough can last 2 or 3 weeks (acute) or may last more than 8 weeks (chronic). Some common causes of a cough can include an infection, allergy, or a cold. HOME CARE  Only take medicine as told by your doctor.  If given, take your medicines (antibiotics) as told. Finish them even if you start to feel better.  Use a cold steam vaporizer or humidier in your home. This can help loosen thick spit (secretions).  Sleep so you are almost sitting up (semi-upright). Use pillows to do this. This helps reduce coughing.  Rest as needed.  Stop smoking if you smoke. GET HELP RIGHT AWAY IF:  You have yellowish-white fluid (pus) in your thick spit.  Your cough gets worse.  Your medicine does not reduce coughing, and you are losing sleep.  You cough up blood.  You have trouble breathing.  Your pain gets worse and medicine does not help.  You have a fever. MAKE SURE YOU:   Understand these instructions.  Will watch your condition.  Will get help right away if you are not doing well or get worse. Document Released: 12/05/2010 Document Revised: 06/16/2011 Document Reviewed: 12/05/2010 [employer] Patient Information 2014 [employer].  

## 2013-08-24 NOTE — ED Notes (Signed)
Patient transported to X-ray 

## 2014-03-14 ENCOUNTER — Other Ambulatory Visit: Payer: Self-pay | Admitting: *Deleted

## 2014-03-14 MED ORDER — GLIPIZIDE 10 MG PO TABS: ORAL_TABLET | ORAL | Status: DC

## 2014-03-14 NOTE — Telephone Encounter (Signed)
I spoke with Danielle Mendez and set her up an appointment for Dec. 17th. I let her know that there would not be any additional refills unless she came in for a follow up- eh

## 2014-03-14 NOTE — Telephone Encounter (Signed)
Refill request

## 2014-03-14 NOTE — Telephone Encounter (Signed)
Danielle Mendez this pt a 30 min appt with me within next 30 days.  Advise her that I gave her 30 days of meds and cannot give her anymore until I see her in office   Route back with appt date

## 2014-03-23 ENCOUNTER — Ambulatory Visit: Payer: BC Managed Care – PPO | Admitting: Internal Medicine

## 2014-03-23 DIAGNOSIS — Z09 Encounter for follow-up examination after completed treatment for conditions other than malignant neoplasm: Secondary | ICD-10-CM

## 2014-05-03 ENCOUNTER — Emergency Department (HOSPITAL_BASED_OUTPATIENT_CLINIC_OR_DEPARTMENT_OTHER)
Admission: EM | Admit: 2014-05-03 | Discharge: 2014-05-03 | Disposition: A | Discharge status: Home / Self Care | Payer: BLUE CROSS/BLUE SHIELD | Attending: Emergency Medicine | Admitting: Emergency Medicine

## 2014-05-03 ENCOUNTER — Encounter (HOSPITAL_BASED_OUTPATIENT_CLINIC_OR_DEPARTMENT_OTHER): Payer: Self-pay

## 2014-05-03 ENCOUNTER — Telehealth: Payer: Self-pay | Admitting: *Deleted

## 2014-05-03 DIAGNOSIS — Z7951 Long term (current) use of inhaled steroids: Secondary | ICD-10-CM | POA: Insufficient documentation

## 2014-05-03 DIAGNOSIS — Z87891 Personal history of nicotine dependence: Secondary | ICD-10-CM | POA: Diagnosis not present

## 2014-05-03 DIAGNOSIS — K219 Gastro-esophageal reflux disease without esophagitis: Secondary | ICD-10-CM | POA: Diagnosis not present

## 2014-05-03 DIAGNOSIS — Z79899 Other long term (current) drug therapy: Secondary | ICD-10-CM | POA: Insufficient documentation

## 2014-05-03 DIAGNOSIS — R609 Edema, unspecified: Secondary | ICD-10-CM | POA: Diagnosis present

## 2014-05-03 DIAGNOSIS — D500 Iron deficiency anemia secondary to blood loss (chronic): Secondary | ICD-10-CM | POA: Diagnosis not present

## 2014-05-03 DIAGNOSIS — I10 Essential (primary) hypertension: Secondary | ICD-10-CM | POA: Diagnosis not present

## 2014-05-03 DIAGNOSIS — E119 Type 2 diabetes mellitus without complications: Secondary | ICD-10-CM

## 2014-05-03 DIAGNOSIS — E780 Pure hypercholesterolemia: Secondary | ICD-10-CM | POA: Diagnosis not present

## 2014-05-03 LAB — CBC WITH DIFFERENTIAL/PLATELET
BASOS ABS: 0 10*3/uL (ref 0.0–0.1)
Basophils Relative: 0 % (ref 0–1)
EOS ABS: 0.3 10*3/uL (ref 0.0–0.7)
Eosinophils Relative: 4 % (ref 0–5)
HCT: 28.4 % — ABNORMAL LOW (ref 36.0–46.0)
Hemoglobin: 8.8 g/dL — ABNORMAL LOW (ref 12.0–15.0)
Lymphocytes Relative: 32 % (ref 12–46)
Lymphs Abs: 2.1 10*3/uL (ref 0.7–4.0)
MCH: 22.1 pg — AB (ref 26.0–34.0)
MCHC: 31 g/dL (ref 30.0–36.0)
MCV: 71.2 fL — ABNORMAL LOW (ref 78.0–100.0)
Monocytes Absolute: 0.5 10*3/uL (ref 0.1–1.0)
Monocytes Relative: 7 % (ref 3–12)
NEUTROS ABS: 3.8 10*3/uL (ref 1.7–7.7)
Neutrophils Relative %: 57 % (ref 43–77)
PLATELETS: 370 10*3/uL (ref 150–400)
RBC: 3.99 MIL/uL (ref 3.87–5.11)
RDW: 14.4 % (ref 11.5–15.5)
WBC: 6.7 10*3/uL (ref 4.0–10.5)

## 2014-05-03 LAB — BASIC METABOLIC PANEL
ANION GAP: 3 — AB (ref 5–15)
BUN: 15 mg/dL (ref 6–23)
CALCIUM: 8.6 mg/dL (ref 8.4–10.5)
CO2: 27 mmol/L (ref 19–32)
Chloride: 104 mmol/L (ref 96–112)
Creatinine, Ser: 0.74 mg/dL (ref 0.50–1.10)
GFR calc non Af Amer: >90 mL/min (ref >90)
Glucose, Bld: 210 mg/dL — ABNORMAL HIGH (ref 70–99)
Potassium: 3.7 mmol/L (ref 3.5–5.1)
SODIUM: 134 mmol/L — AB (ref 135–145)

## 2014-05-03 LAB — CBG MONITORING, ED: Glucose-Capillary: 205 mg/dL — ABNORMAL HIGH (ref 70–99)

## 2014-05-03 LAB — OCCULT BLOOD X 1 CARD TO LAB, STOOL: Fecal Occult Bld: NEGATIVE

## 2014-05-03 LAB — TROPONIN I

## 2014-05-03 MED ORDER — METFORMIN HCL 1000 MG PO TABS
1000.0000 mg | ORAL_TABLET | Freq: Every day | ORAL | Status: DC
Start: 2014-05-03 — End: 2015-04-11

## 2014-05-03 MED ORDER — LISINOPRIL 10 MG PO TABS: 10.0000 mg | ORAL_TABLET | Freq: Once | ORAL | Status: DC

## 2014-05-03 MED ORDER — METFORMIN HCL 500 MG PO TABS
1000.0000 mg | ORAL_TABLET | Freq: Once | ORAL | Status: AC
  Administered 2014-05-03: 1000 mg via ORAL
  Filled 2014-05-03: qty 2

## 2014-05-03 MED ORDER — HYDROCHLOROTHIAZIDE 25 MG PO TABS: 25.0000 mg | ORAL_TABLET | Freq: Once | ORAL | Status: DC

## 2014-05-03 MED ORDER — HYDROCHLOROTHIAZIDE 25 MG PO TABS
25.0000 mg | ORAL_TABLET | Freq: Once | ORAL | Status: AC
  Administered 2014-05-03: 25 mg via ORAL
  Filled 2014-05-03: qty 1

## 2014-05-03 MED ORDER — INTEGRA 62.5-62.5-40-3 MG PO CAPS: ORAL_CAPSULE | ORAL | Status: DC

## 2014-05-03 MED ORDER — LISINOPRIL 10 MG PO TABS
10.0000 mg | ORAL_TABLET | Freq: Once | ORAL | Status: AC
  Administered 2014-05-03: 10 mg via ORAL
  Filled 2014-05-03: qty 1

## 2014-05-03 NOTE — ED Provider Notes (Signed)
CSN: [id number]     Arrival date & time 05/03/14  0840 History   First MD Initiated Contact with Patient 05/03/14 [phone]     Chief Complaint  Patient presents with  . edema, hypertension      (Consider location/radiation/quality/duration/timing/severity/associated sxs/prior Treatment) HPI The patient ports that she's been out of her blood pressure medications and her diabetic medications for over a month. She reports she was at work and was feeling fatigued and she is noted swelling in her ankles that started over the past 2 days. She reports her blood pressure work was noted to be 200/110. She tried taking an older sample medication that she had for her blood pressure but she doesn't think it worked. She reports she called her family physician and they advised her to come to the emergency department for assessment and initial treatment. The patient denies she ever had any chest pain or shortness of breath in association with her symptoms. Predominantly she has noted increasing fatigue with exertion over the past several weeks. She denies blurred vision or headache. The patient denies pain burning or urgency with urination. She reports she does have very heavy menstrual cycles and has been very anemic in the past. She reports she is currently on her menses. She reports she has taken iron in the past but is not taking any currently. She denies any black or dark appearing stool or having seen any blood in her stool ever. Past Medical History  Diagnosis Date  . Hypertension   . Diabetes mellitus without complication   . Hypercholesteremia   . GERD (gastroesophageal reflux disease)    History reviewed. No pertinent past surgical history. No family history on file. History  Substance Use Topics  . Smoking status: Former Smoker    Quit date: 04/14/2013  . Smokeless tobacco: Not on file  . Alcohol Use: No   OB History    No data available     Review of Systems 10 Systems reviewed and are  negative for acute change except as noted in the HPI.    Allergies  Bactrim  Home Medications   Prior to Admission medications   Medication Sig Start Date End Date Taking? Authorizing Provider  albuterol (PROVENTIL HFA;VENTOLIN HFA) 108 (90 BASE) MCG/ACT inhaler Inhale 2 puffs into the lungs every 6 (six) hours as needed for wheezing or shortness of breath.    Historical Provider, MD  atorvastatin (LIPITOR) 10 MG tablet Take 10 mg by mouth daily.    Historical Provider, MD  cetirizine (ZYRTEC ALLERGY) 10 MG tablet Take 1 tablet (10 mg total) by mouth daily. 08/24/13   April K Palumbo-Rasch, MD  ergocalciferol (VITAMIN D2) 50000 UNITS capsule Take 1 capsule (50,000 Units total) by mouth once a week. 05/03/13   Lanice Shirts, MD  Fe Fum-FePoly-Vit C-Vit B3 (INTEGRA) 62.5-62.5-40-3 MG CAPS Take one daily  Give generic 05/03/14   Charlesetta Shanks, MD  fluticasone [facility]) 50 MCG/ACT nasal spray Place 2 sprays into both nostrils daily. 08/24/13   April K Palumbo-Rasch, MD  gabapentin (NEURONTIN) 300 MG capsule Take 300 mg by mouth 3 (three) times daily.    Historical Provider, MD  glipiZIDE (GLUCOTROL) 10 MG tablet Take one tablet in am and 1/2 tab before dinner 03/14/14   Lanice Shirts, MD  hydrochlorothiazide (HYDRODIURIL) 25 MG tablet Take 1 tablet (25 mg total) by mouth once. 05/03/14   Charlesetta Shanks, MD  ibuprofen (ADVIL,MOTRIN) 800 MG tablet Take 1 tablet (800 mg total) by mouth  3 (three) times daily. 08/24/13   April K Palumbo-Rasch, MD  lisinopril (PRINIVIL,ZESTRIL) 10 MG tablet Take 1 tablet (10 mg total) by mouth once. 05/03/14   Charlesetta Shanks, MD  metFORMIN (GLUCOPHAGE) 1000 MG tablet Take 1 tablet (1,000 mg total) by mouth daily with breakfast. 05/03/14   Charlesetta Shanks, MD  naproxen (NAPROSYN) 500 MG tablet  04/21/13   Historical Provider, MD  pantoprazole (PROTONIX) 20 MG tablet Take 20 mg by mouth daily.    Historical Provider, MD   BP 148/76 mmHg  Pulse 82  Temp(Src) 97.7  F (36.5 C) (Oral)  Resp 18  Wt 238 lb (107.956 kg)  SpO2 95% Physical Exam  Constitutional: She is oriented to person, place, and time. She appears well-developed and well-nourished.  The patient is moderately obese. She is in no acute distress. She is alert and interactive without respiratory distress or mental status changes.  HENT:  Head: Normocephalic and atraumatic.  Eyes: EOM are normal. Pupils are equal, round, and reactive to light.  Neck: Neck supple.  Cardiovascular: Normal rate, regular rhythm, normal heart sounds and intact distal pulses.   Pulmonary/Chest: Effort normal and breath sounds normal.  Abdominal: Soft. Bowel sounds are normal. She exhibits no distension. There is no tenderness.  Genitourinary:  The rectal examination does not show any blood or stool in the rectal vault. There is brown tinged stool present. No melena.  Musculoskeletal: Normal range of motion. She exhibits edema.  The patient is very mild pedal edema approximately 1+ on the feet and ankles. The calves are soft and nontender.  Neurological: She is alert and oriented to person, place, and time. She has normal strength. Coordination normal. GCS eye subscore is 4. GCS verbal subscore is 5. GCS motor subscore is 6.  Skin: Skin is warm, dry and intact.  Psychiatric: She has a normal mood and affect.    ED Course  Procedures (including critical care time) Labs Review Labs Reviewed  BASIC METABOLIC PANEL - Abnormal; Notable for the following:    Sodium 134 (*)    Glucose, Bld 210 (*)    Anion gap 3 (*)    All other components within normal limits  CBC WITH DIFFERENTIAL/PLATELET - Abnormal; Notable for the following:    Hemoglobin 8.8 (*)    HCT 28.4 (*)    MCV 71.2 (*)    MCH 22.1 (*)    All other components within normal limits  CBG MONITORING, ED - Abnormal; Notable for the following:    Glucose-Capillary 205 (*)    All other components within normal limits  TROPONIN I  OCCULT BLOOD X 1  CARD TO LAB, STOOL    Imaging Review No results found.   EKG Interpretation None      MDM   Final diagnoses:  Iron deficiency anemia due to chronic blood loss  Essential hypertension  Type 2 diabetes mellitus without complication   I suspect the patient's fatigue that she's noted over the past couple weeks with exertion and working is associated with anemia. The patient does report she has a history of heavy menstrual cycles and anemia with a prior count as low as 5 mg/dL. She reports she used to take iron supplement but has not taken any for while. At this point there is no evidence that the bleeding is GI in nature. She reports her menstrual bleeding is tapering off now. She does not have any associated signs of endorgan damage with hypertension such as blurred vision headache or  chest pain. At this time she is safe for discharge and follow-up with her family physician and her gynecologist.    Charlesetta Shanks, MD 05/03/14 1230

## 2014-05-03 NOTE — Discharge Instructions (Signed)
Anemia, Nonspecific Anemia is a condition in which the concentration of red blood cells or hemoglobin in the blood is below normal. Hemoglobin is a substance in red blood cells that carries oxygen to the tissues of the body. Anemia results in not enough oxygen reaching these tissues.  CAUSES  Common causes of anemia include:   Excessive bleeding. Bleeding may be internal or external. This includes excessive bleeding from periods (in women) or from the intestine.   Poor nutrition.   Chronic kidney, thyroid, and liver disease.  Bone marrow disorders that decrease red blood cell production.  Cancer and treatments for cancer.  HIV, AIDS, and their treatments.  Spleen problems that increase red blood cell destruction.  Blood disorders.  Excess destruction of red blood cells due to infection, medicines, and autoimmune disorders. SIGNS AND SYMPTOMS   Minor weakness.   Dizziness.   Headache.  Palpitations.   Shortness of breath, especially with exercise.   Paleness.  Cold sensitivity.  Indigestion.  Nausea.  Difficulty sleeping.  Difficulty concentrating. Symptoms may occur suddenly or they may develop slowly.  DIAGNOSIS  Additional blood tests are often needed. These help your health care provider determine the best treatment. Your health care provider will check your stool for blood and look for other causes of blood loss.  TREATMENT  Treatment varies depending on the cause of the anemia. Treatment can include:   Supplements of iron, vitamin U63, or folic acid.   Hormone medicines.   A blood transfusion. This may be needed if blood loss is severe.   Hospitalization. This may be needed if there is significant continual blood loss.   Dietary changes.  Spleen removal. HOME CARE INSTRUCTIONS Keep all follow-up appointments. It often takes many weeks to correct anemia, and having your health care provider check on your condition and your response to  treatment is very important. SEEK IMMEDIATE MEDICAL CARE IF:   You develop extreme weakness, shortness of breath, or chest pain.   You become dizzy or have trouble concentrating.  You develop heavy vaginal bleeding.   You develop a rash.   You have bloody or black, tarry stools.   You faint.   You vomit up blood.   You vomit repeatedly.   You have abdominal pain.  You have a fever or persistent symptoms for more than 2-3 days.   You have a fever and your symptoms suddenly get worse.   You are dehydrated.  MAKE SURE YOU:  Understand these instructions.  Will watch your condition.  Will get help right away if you are not doing well or get worse. Document Released: 05/01/2004 Document Revised: 11/24/2012 Document Reviewed: 09/17/2012 [facility] Patient Information 2015 [place], [place]. This information is not intended to replace advice given to you by your health care provider. Make sure you discuss any questions you have with your health care provider. Hypertension Hypertension, commonly called high blood pressure, is when the force of blood pumping through your arteries is too strong. Your arteries are the blood vessels that carry blood from your heart throughout your body. A blood pressure reading consists of a higher number over a lower number, such as 110/72. The higher number (systolic) is the pressure inside your arteries when your heart pumps. The lower number (diastolic) is the pressure inside your arteries when your heart relaxes. Ideally you want your blood pressure below 120/80. Hypertension forces your heart to work harder to pump blood. Your arteries may become narrow or stiff. Having hypertension puts you at  risk for heart disease, stroke, and other problems.  RISK FACTORS Some risk factors for high blood pressure are controllable. Others are not.  Risk factors you cannot control include:   Race. You may be at higher risk if you are African  American.  Age. Risk increases with age.  Gender. Men are at higher risk than women before age 29 years. After age 56, women are at higher risk than men. Risk factors you can control include:  Not getting enough exercise or physical activity.  Being overweight.  Getting too much fat, sugar, calories, or salt in your diet.  Drinking too much alcohol. SIGNS AND SYMPTOMS Hypertension does not usually cause signs or symptoms. Extremely high blood pressure (hypertensive crisis) may cause headache, anxiety, shortness of breath, and nosebleed. DIAGNOSIS  To check if you have hypertension, your health care provider will measure your blood pressure while you are seated, with your arm held at the level of your heart. It should be measured at least twice using the same arm. Certain conditions can cause a difference in blood pressure between your right and left arms. A blood pressure reading that is higher than normal on one occasion does not mean that you need treatment. If one blood pressure reading is high, ask your health care provider about having it checked again. TREATMENT  Treating high blood pressure includes making lifestyle changes and possibly taking medicine. Living a healthy lifestyle can help lower high blood pressure. You may need to change some of your habits. Lifestyle changes may include:  Following the DASH diet. This diet is high in fruits, vegetables, and whole grains. It is low in salt, red meat, and added sugars.  Getting at least 2 hours of brisk physical activity every week.  Losing weight if necessary.  Not smoking.  Limiting alcoholic beverages.  Learning ways to reduce stress. If lifestyle changes are not enough to get your blood pressure under control, your health care provider may prescribe medicine. You may need to take more than one. Work closely with your health care provider to understand the risks and benefits. HOME CARE INSTRUCTIONS  Have your blood  pressure rechecked as directed by your health care provider.   Take medicines only as directed by your health care provider. Follow the directions carefully. Blood pressure medicines must be taken as prescribed. The medicine does not work as well when you skip doses. Skipping doses also puts you at risk for problems.   Do not smoke.   Monitor your blood pressure at home as directed by your health care provider. SEEK MEDICAL CARE IF:   You think you are having a reaction to medicines taken.  You have recurrent headaches or feel dizzy.  You have swelling in your ankles.  You have trouble with your vision. SEEK IMMEDIATE MEDICAL CARE IF:  You develop a severe headache or confusion.  You have unusual weakness, numbness, or feel faint.  You have severe chest or abdominal pain.  You vomit repeatedly.  You have trouble breathing. MAKE SURE YOU:   Understand these instructions.  Will watch your condition.  Will get help right away if you are not doing well or get worse. Document Released: 03/24/2005 Document Revised: 08/08/2013 Document Reviewed: 01/14/2013 [facility] Patient Information 2015 [place], [place]. This information is not intended to replace advice given to you by your health care provider. Make sure you discuss any questions you have with your health care provider. Diabetes and Exercise Exercising regularly is important. It is not  just about losing weight. It has many health benefits, such as:  Improving your overall fitness, flexibility, and endurance.  Increasing your bone density.  Helping with weight control.  Decreasing your body fat.  Increasing your muscle strength.  Reducing stress and tension.  Improving your overall health. People with diabetes who exercise gain additional benefits because exercise:  Reduces appetite.  Improves the body's use of blood sugar (glucose).  Helps lower or control blood glucose.  Decreases blood pressure.  Helps  control blood lipids (such as cholesterol and triglycerides).  Improves the body's use of the hormone insulin by:  Increasing the body's insulin sensitivity.  Reducing the body's insulin needs.  Decreases the risk for heart disease because exercising:  Lowers cholesterol and triglycerides levels.  Increases the levels of good cholesterol (such as high-density lipoproteins [HDL]) in the body.  Lowers blood glucose levels. YOUR ACTIVITY PLAN  Choose an activity that you enjoy and set realistic goals. Your health care provider or diabetes educator can help you make an activity plan that works for you. Exercise regularly as directed by your health care provider. This includes:  Performing resistance training twice a week such as push-ups, sit-ups, lifting weights, or using resistance bands.  Performing 150 minutes of cardio exercises each week such as walking, running, or playing sports.  Staying active and spending no more than 90 minutes at one time being inactive. Even short bursts of exercise are good for you. Three 10-minute sessions spread throughout the day are just as beneficial as a single 30-minute session. Some exercise ideas include:  Taking the dog for a walk.  Taking the stairs instead of the elevator.  Dancing to your favorite song.  Doing an exercise video.  Doing your favorite exercise with a friend. RECOMMENDATIONS FOR EXERCISING WITH TYPE 1 OR TYPE 2 DIABETES   Check your blood glucose before exercising. If blood glucose levels are greater than 240 mg/dL, check for urine ketones. Do not exercise if ketones are present.  Avoid injecting insulin into areas of the body that are going to be exercised. For example, avoid injecting insulin into:  The arms when playing tennis.  The legs when jogging.  Keep a record of:  Food intake before and after you exercise.  Expected peak times of insulin action.  Blood glucose levels before and after you  exercise.  The type and amount of exercise you have done.  Review your records with your health care provider. Your health care provider will help you to develop guidelines for adjusting food intake and insulin amounts before and after exercising.  If you take insulin or oral hypoglycemic agents, watch for signs and symptoms of hypoglycemia. They include:  Dizziness.  Shaking.  Sweating.  Chills.  Confusion.  Drink plenty of water while you exercise to prevent dehydration or heat stroke. Body water is lost during exercise and must be replaced.  Talk to your health care provider before starting an exercise program to make sure it is safe for you. Remember, almost any type of activity is better than none. Document Released: 06/14/2003 Document Revised: 08/08/2013 Document Reviewed: 08/31/2012 [facility] Patient Information 2015 [place], [place]. This information is not intended to replace advice given to you by your health care provider. Make sure you discuss any questions you have with your health care provider.

## 2014-05-03 NOTE — ED Notes (Signed)
Patient here with reporting that her BP is running high x 2 days and increased pedal edema. Reports fatigue for same. Reports out of BP meds and diabetic meds. Also reports increased personal stress

## 2014-05-03 NOTE — Telephone Encounter (Signed)
Patient walked into office this morning around 8:20 am. She stated that she was sent home from work due to her BP being 210/108, with dizziness, headache and swelling of feet. She was instructed to go down stairs to the ED given the SX she presented with. Patient understood. -eh

## 2014-05-03 NOTE — ED Notes (Signed)
Pt states she ran out of her lisinopril with htcz and has been taking her old lisinopril that does not have htcz for one month.

## 2014-05-11 ENCOUNTER — Telehealth: Payer: Self-pay | Admitting: Internal Medicine

## 2014-05-11 ENCOUNTER — Encounter: Payer: BLUE CROSS/BLUE SHIELD | Admitting: Internal Medicine

## 2014-05-11 DIAGNOSIS — R030 Elevated blood-pressure reading, without diagnosis of hypertension: Secondary | ICD-10-CM

## 2014-05-11 NOTE — Telephone Encounter (Signed)
Call pt and give her a 30 min OV for ER follow up  Advise her that her anemia and her BP need to be taken care of  And she needs to be seen in office  Route back her response to me

## 2014-05-11 NOTE — Progress Notes (Signed)
   Subjective:    Patient ID: Danielle Mendez, female    DOB: Feb 19, 1976, 39 y.o.   MRN: [id number]  HPI [facility]   Review of Systems     Objective:   Physical Exam        Assessment & Plan:

## 2014-05-15 NOTE — Telephone Encounter (Signed)
I spoke with T[place]ane this morning and she said that she was getting her father settled in a nursing home and would call back to R/S her appointment

## 2014-06-01 ENCOUNTER — Other Ambulatory Visit: Payer: Self-pay | Admitting: Internal Medicine

## 2014-06-05 ENCOUNTER — Encounter: Payer: Self-pay | Admitting: Internal Medicine

## 2014-06-05 NOTE — Telephone Encounter (Signed)
Refill request

## 2014-06-06 ENCOUNTER — Encounter: Payer: Self-pay | Admitting: Internal Medicine

## 2015-01-24 IMAGING — CT CT ABD-PELV W/O CM
2 of 4 series · 17 of 46 positions shown, 19 images · non-contrast
Comparison: None.

CLINICAL DATA: Left flank pain.

EXAM:
CT ABDOMEN AND PELVIS WITHOUT CONTRAST
TECHNIQUE: Multidetector CT imaging of the abdomen and pelvis was performed
following the standard protocol without intravenous contrast.

[Series 3: renal stone > 200 lbs 5.0 b31f · axial · 0.83mm/px · z∈[+947,+1352]mm · 14 of 89 slices shown, 16 images]
[im 4/89  soft-tissue]
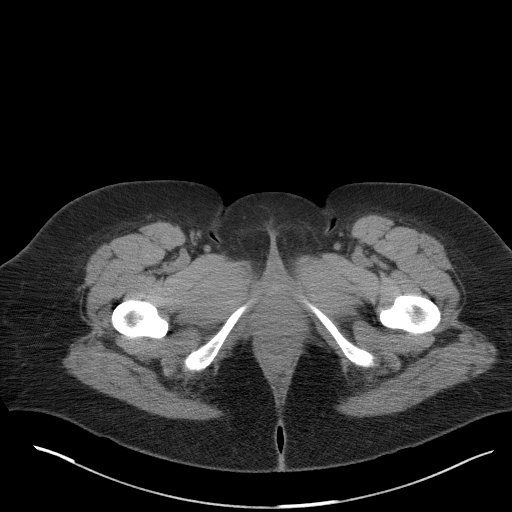
[im 4/89  bone]
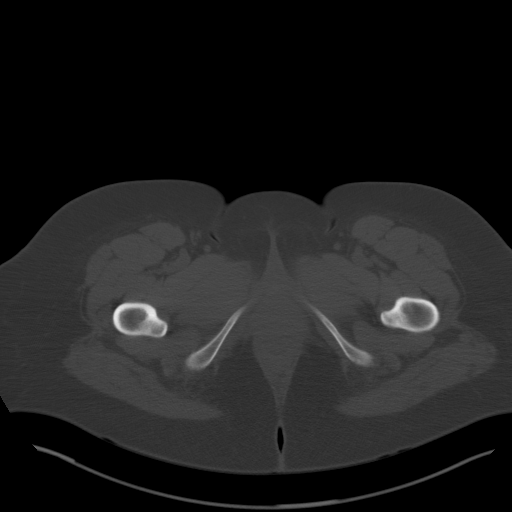
[im 12/89  soft-tissue]
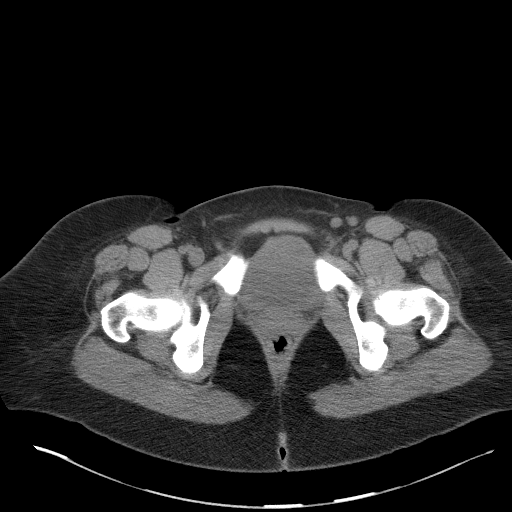
[im 19/89  soft-tissue]
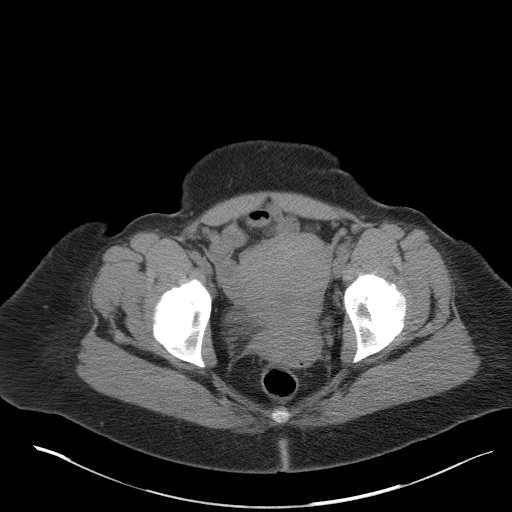
[im 23/89  soft-tissue]
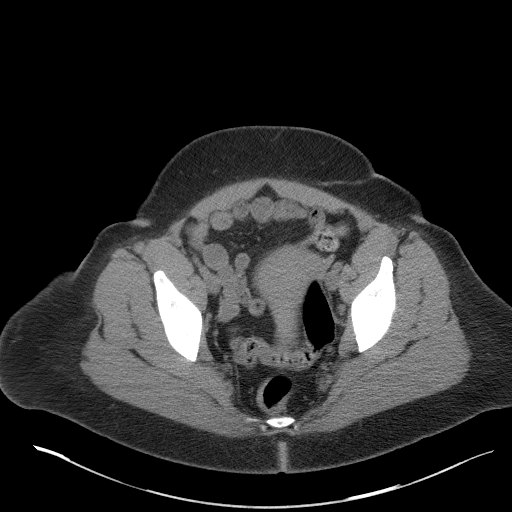
[im 30/89  soft-tissue]
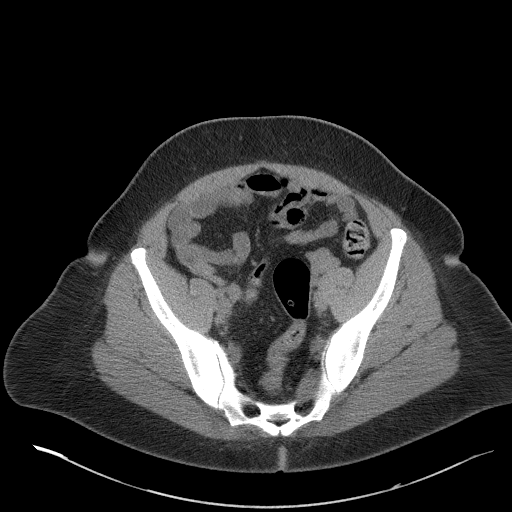
[im 37/89  soft-tissue]
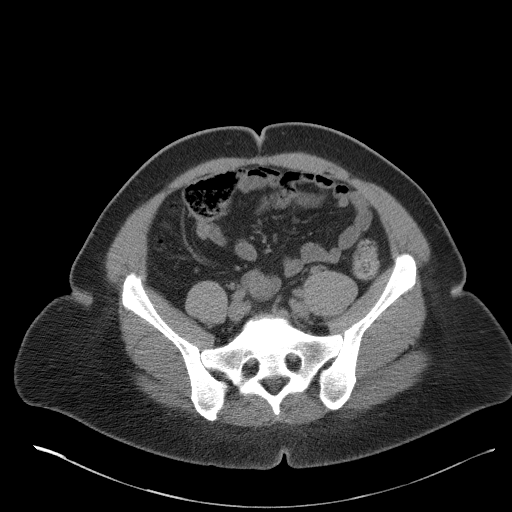
[im 41/89  soft-tissue]
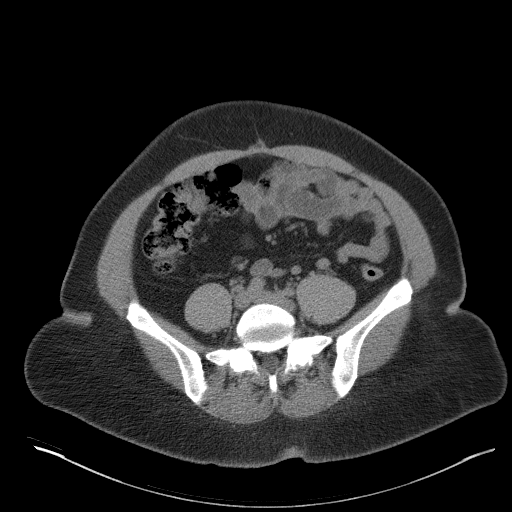
[im 48/89  soft-tissue]
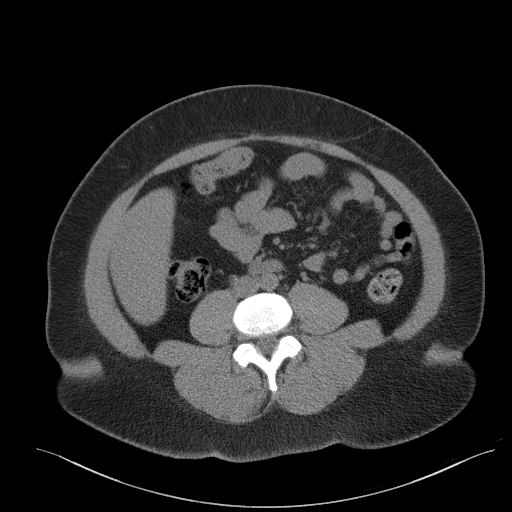
[im 52/89  soft-tissue]
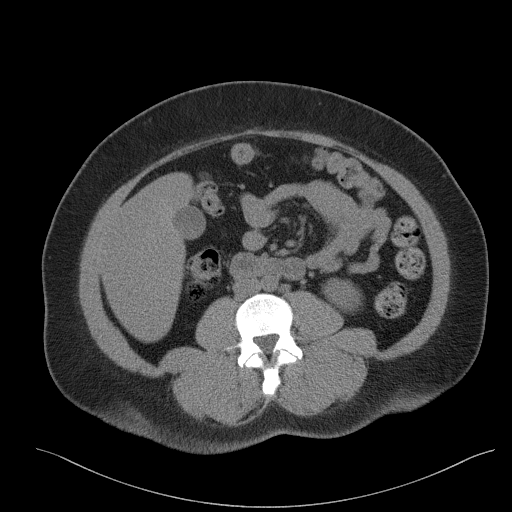
[im 52/89  bone]
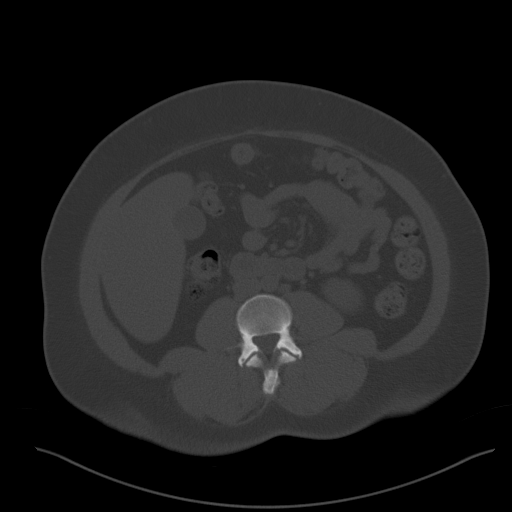
[im 59/89  soft-tissue]
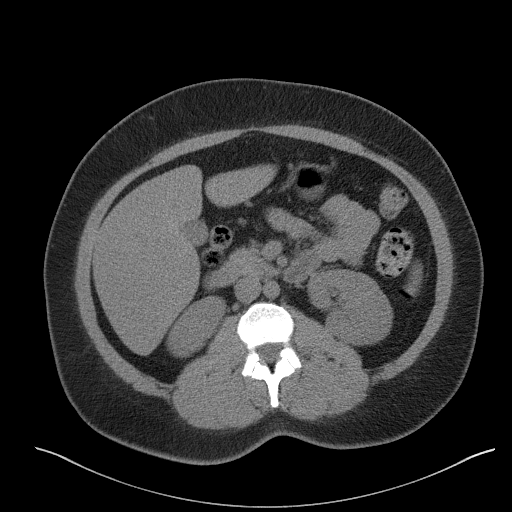
[im 67/89  soft-tissue]
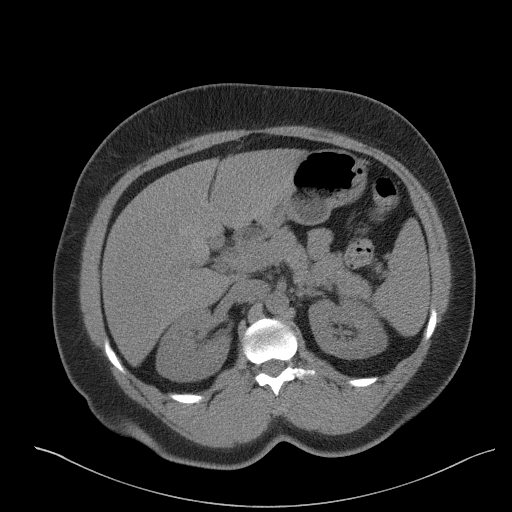
[im 70/89  soft-tissue]
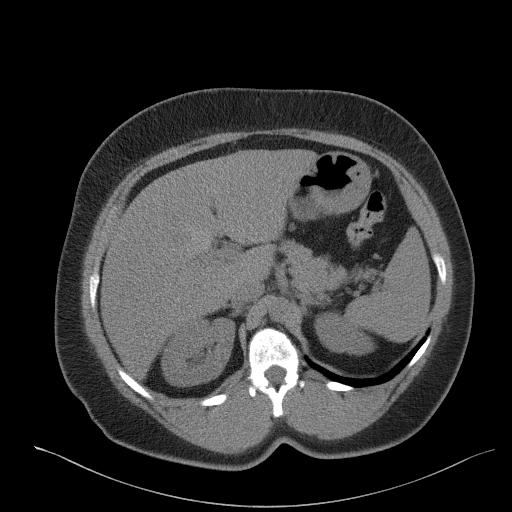
[im 78/89  soft-tissue]
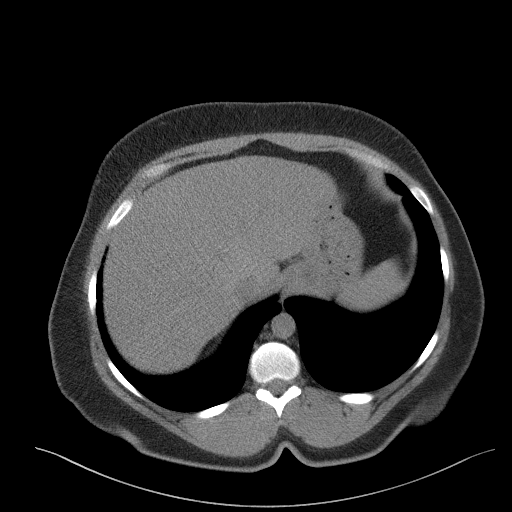
[im 85/89  soft-tissue]
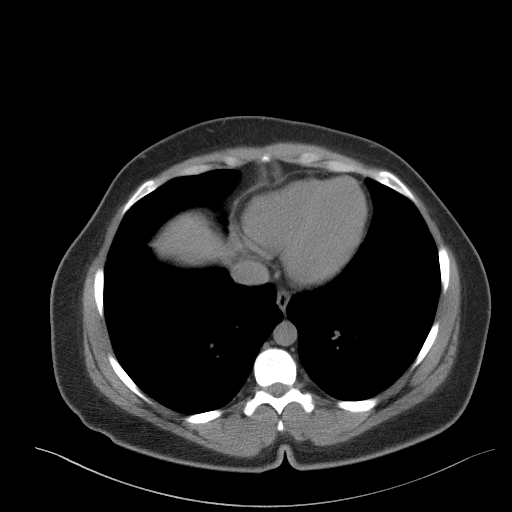

[Series 6: renal stone 3.0 coronal · coronal · 0.84mm/px · 3 of 105 slices shown]
[im 35/105  soft-tissue]
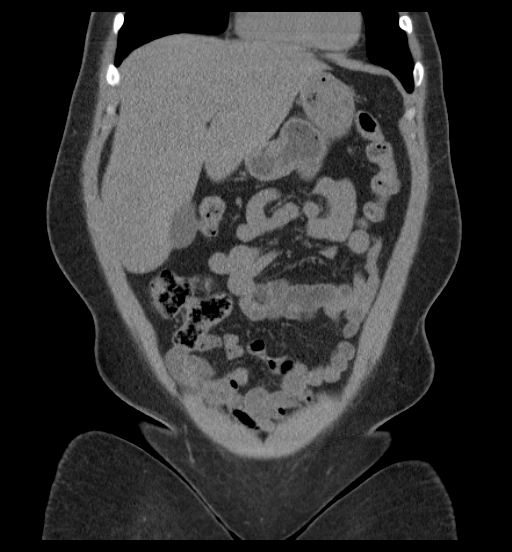
[im 47/105  soft-tissue]
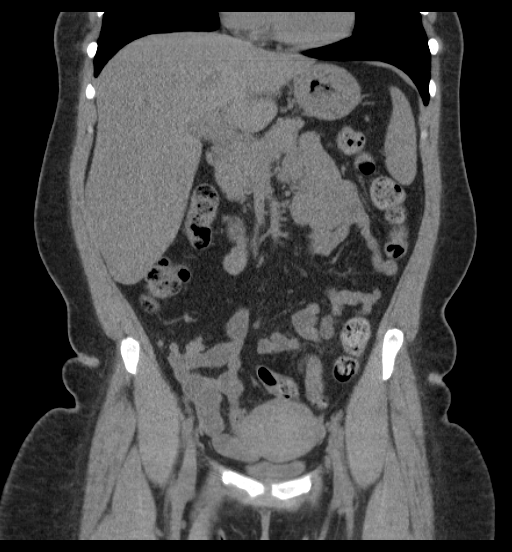
[im 58/105  soft-tissue]
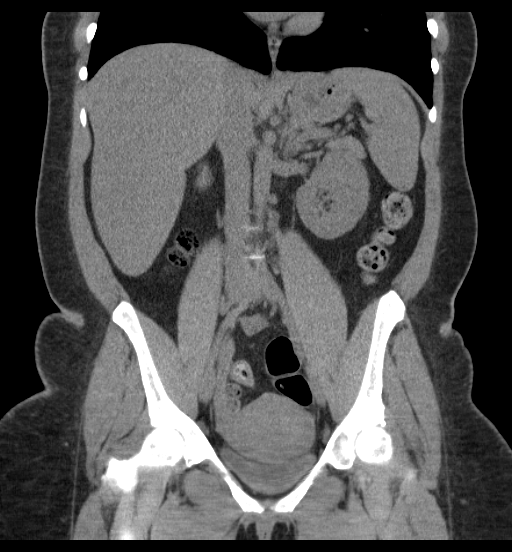

[17 of 46 positions shown; findings below may reference images not displayed]

FINDINGS: BODY WALL: Unremarkable.

LOWER CHEST: Unremarkable.

ABDOMEN/PELVIS:

Liver: Diffuse fatty infiltration of the liver. The liver is
prominent in size, 18 cm in maximal craniocaudal span. Stone

Biliary: No evidence of biliary obstruction or stone.

Pancreas: Unremarkable.

Spleen: Unremarkable.

Adrenals: Unremarkable.

Kidneys and ureters: No hydronephrosis or stone.

Bladder: Unremarkable.

Reproductive: Unremarkable.

Bowel: No obstruction. Normal appendix.

Retroperitoneum: Mildly prominent periaortic lymph nodes, not
definitively pathologic.

Peritoneum: No free fluid or gas.

Vascular: No acute abnormality.

OSSEOUS: Early degenerative change to the SI joints with small
spurs. Mild lower lumbar facet osteoarthritis. There is disc bulging
at L5-S1 which effaces the inferior foramina.
IMPRESSION: 1. No hydronephrosis or nephrolithiasis.
2. Fatty liver.

## 2015-04-10 ENCOUNTER — Encounter (HOSPITAL_BASED_OUTPATIENT_CLINIC_OR_DEPARTMENT_OTHER): Payer: Self-pay | Admitting: *Deleted

## 2015-04-10 ENCOUNTER — Emergency Department (HOSPITAL_BASED_OUTPATIENT_CLINIC_OR_DEPARTMENT_OTHER)
Admission: EM | Admit: 2015-04-10 | Discharge: 2015-04-11 | Disposition: A | Discharge status: Home / Self Care | Payer: BLUE CROSS/BLUE SHIELD | Attending: Emergency Medicine | Admitting: Emergency Medicine

## 2015-04-10 DIAGNOSIS — E669 Obesity, unspecified: Secondary | ICD-10-CM | POA: Insufficient documentation

## 2015-04-10 DIAGNOSIS — R739 Hyperglycemia, unspecified: Secondary | ICD-10-CM

## 2015-04-10 DIAGNOSIS — Z79899 Other long term (current) drug therapy: Secondary | ICD-10-CM | POA: Insufficient documentation

## 2015-04-10 DIAGNOSIS — E7800 Pure hypercholesterolemia, unspecified: Secondary | ICD-10-CM | POA: Insufficient documentation

## 2015-04-10 DIAGNOSIS — K219 Gastro-esophageal reflux disease without esophagitis: Secondary | ICD-10-CM | POA: Insufficient documentation

## 2015-04-10 DIAGNOSIS — Z7951 Long term (current) use of inhaled steroids: Secondary | ICD-10-CM | POA: Insufficient documentation

## 2015-04-10 DIAGNOSIS — E1165 Type 2 diabetes mellitus with hyperglycemia: Secondary | ICD-10-CM | POA: Insufficient documentation

## 2015-04-10 DIAGNOSIS — Z87891 Personal history of nicotine dependence: Secondary | ICD-10-CM | POA: Insufficient documentation

## 2015-04-10 DIAGNOSIS — I10 Essential (primary) hypertension: Secondary | ICD-10-CM

## 2015-04-10 DIAGNOSIS — R51 Headache: Secondary | ICD-10-CM | POA: Insufficient documentation

## 2015-04-10 LAB — URINALYSIS, ROUTINE W REFLEX MICROSCOPIC
BILIRUBIN URINE: NEGATIVE
Glucose, UA: >1000 mg/dL — AB
Ketones, ur: 15 mg/dL — AB
Leukocytes, UA: NEGATIVE
Nitrite: NEGATIVE
Protein, ur: NEGATIVE mg/dL
Specific Gravity, Urine: 1.04 — ABNORMAL HIGH (ref 1.005–1.030)
pH: 5 (ref 5.0–8.0)

## 2015-04-10 LAB — URINE MICROSCOPIC-ADD ON

## 2015-04-10 LAB — CBG MONITORING, ED: Glucose-Capillary: 326 mg/dL — ABNORMAL HIGH (ref 65–99)

## 2015-04-10 NOTE — ED Notes (Signed)
She ran out of medications 4 months ago. States she has been feeling shaky for the past week.

## 2015-04-10 NOTE — ED Notes (Signed)
Pt is having her menses. Urine obtained at triage may have blood noted.

## 2015-04-11 LAB — CBC WITH DIFFERENTIAL/PLATELET
BASOS ABS: 0 10*3/uL (ref 0.0–0.1)
Basophils Relative: 0 %
EOS ABS: 0.2 10*3/uL (ref 0.0–0.7)
Eosinophils Relative: 3 %
HCT: 31.2 % — ABNORMAL LOW (ref 36.0–46.0)
Hemoglobin: 9.6 g/dL — ABNORMAL LOW (ref 12.0–15.0)
Lymphocytes Relative: 33 %
Lymphs Abs: 3 10*3/uL (ref 0.7–4.0)
MCH: 20.8 pg — ABNORMAL LOW (ref 26.0–34.0)
MCHC: 30.8 g/dL (ref 30.0–36.0)
MCV: 67.5 fL — ABNORMAL LOW (ref 78.0–100.0)
Monocytes Absolute: 0.6 10*3/uL (ref 0.1–1.0)
Monocytes Relative: 7 %
Neutro Abs: 5.1 10*3/uL (ref 1.7–7.7)
Neutrophils Relative %: 57 %
Platelets: 309 10*3/uL (ref 150–400)
RBC: 4.62 MIL/uL (ref 3.87–5.11)
RDW: 15.3 % (ref 11.5–15.5)
WBC: 9 10*3/uL (ref 4.0–10.5)

## 2015-04-11 LAB — BASIC METABOLIC PANEL
Anion gap: 8 (ref 5–15)
BUN: 17 mg/dL (ref 6–20)
CALCIUM: 8.3 mg/dL — AB (ref 8.9–10.3)
CO2: 21 mmol/L — ABNORMAL LOW (ref 22–32)
Chloride: 103 mmol/L (ref 101–111)
Creatinine, Ser: 0.79 mg/dL (ref 0.44–1.00)
GFR calc non Af Amer: >60 mL/min (ref >60)
Glucose, Bld: 365 mg/dL — ABNORMAL HIGH (ref 65–99)
Potassium: 3.9 mmol/L (ref 3.5–5.1)
SODIUM: 132 mmol/L — AB (ref 135–145)

## 2015-04-11 MED ORDER — METFORMIN HCL 1000 MG PO TABS: 1000.0000 mg | ORAL_TABLET | Freq: Every day | ORAL | Status: DC

## 2015-04-11 MED ORDER — LISINOPRIL 10 MG PO TABS: 10.0000 mg | ORAL_TABLET | Freq: Every day | ORAL | Status: DC

## 2015-04-11 MED ORDER — IBUPROFEN 200 MG PO TABS
600.0000 mg | ORAL_TABLET | Freq: Once | ORAL | Status: AC
  Administered 2015-04-11: 600 mg via ORAL
  Filled 2015-04-11: qty 1

## 2015-04-11 MED ORDER — HYDROCHLOROTHIAZIDE 25 MG PO TABS: 25.0000 mg | ORAL_TABLET | Freq: Every day | ORAL | Status: AC

## 2015-04-11 MED ORDER — METFORMIN HCL 500 MG PO TABS
500.0000 mg | ORAL_TABLET | Freq: Once | ORAL | Status: AC
  Administered 2015-04-11: 500 mg via ORAL
  Filled 2015-04-11: qty 1

## 2015-04-11 NOTE — ED Provider Notes (Signed)
CSN: [id number]     Arrival date & time 04/10/15  2130 History   By signing my name below, I, Danielle Mendez, attest that this documentation has been prepared under the direction and in the presence of Danielle Hacker, MD.  Electronically Signed: Forrestine Mendez, ED Scribe. 04/11/2015. 12:15 AM.   Chief Complaint  Patient presents with  . Hypertension  . Hyperglycemia   HPI  HPI Comments: Danielle Mendez is a 40 y.o. female with a PMHx of HTN and DM who presents to the Emergency Department here for hypertension and hyperglycemia this evening. Pt reports constant, ongoing HA and feeling shaky x 2 weeks. No aggravating or alleviating factors at this time. OTC Tylenol attempted prior to arrival. Pt states she also took a dose of a friends Lisinopril today. No recent fever, chills, nausea, vomiting, chest pain, or shortness of breath. Ms. Danielle Mendez states her blood sugars have been high in the last few days. Most recent reading at home- too high to read  Several days ago. Pt has been out of her medications for 3-4 months as she is waiting for her insurance to kick in.  PCP: Pcp Not In System    Past Medical History  Diagnosis Date  . Hypertension   . Diabetes mellitus without complication ([place])   . Hypercholesteremia   . GERD (gastroesophageal reflux disease)    History reviewed. No pertinent past surgical history. No family history on file. Social History  Substance Use Topics  . Smoking status: Former Smoker    Quit date: 04/14/2013  . Smokeless tobacco: None  . Alcohol Use: No   OB History    No data available     Review of Systems  Constitutional: Negative for fever.  Respiratory: Negative for chest tightness and shortness of breath.   Cardiovascular: Negative for chest pain.  Gastrointestinal: Negative for nausea, vomiting and abdominal pain.  Genitourinary: Negative for dysuria.  Musculoskeletal: Negative for back pain.  Neurological: Positive for headaches.   Psychiatric/Behavioral: Negative for confusion.  All other systems reviewed and are negative.     Allergies  Bactrim  Home Medications   Prior to Admission medications   Medication Sig Start Date End Date Taking? Authorizing Provider  albuterol (PROVENTIL HFA;VENTOLIN HFA) 108 (90 BASE) MCG/ACT inhaler Inhale 2 puffs into the lungs every 6 (six) hours as needed for wheezing or shortness of breath.    Historical Provider, MD  atorvastatin (LIPITOR) 10 MG tablet Take 10 mg by mouth daily.    Historical Provider, MD  cetirizine (ZYRTEC ALLERGY) 10 MG tablet Take 1 tablet (10 mg total) by mouth daily. 08/24/13   Danielle Palumbo, MD  ergocalciferol (VITAMIN D2) 50000 UNITS capsule Take 1 capsule (50,000 Units total) by mouth once a week. 05/03/13   Danielle Shirts, MD  Fe Fum-FePoly-Vit C-Vit B3 (INTEGRA) 62.5-62.5-40-3 MG CAPS Take one daily  Give generic 05/03/14   Danielle Shanks, MD  fluticasone [facility]) 50 MCG/ACT nasal spray Place 2 sprays into both nostrils daily. 08/24/13   Danielle Palumbo, MD  gabapentin (NEURONTIN) 300 MG capsule Take 300 mg by mouth 3 (three) times daily.    Historical Provider, MD  glipiZIDE (GLUCOTROL) 10 MG tablet Take one tablet in am and 1/2 tab before dinner 03/14/14   Danielle Shirts, MD  hydrochlorothiazide (HYDRODIURIL) 25 MG tablet Take 1 tablet (25 mg total) by mouth daily. 04/11/15   Danielle Hacker, MD  ibuprofen (ADVIL,MOTRIN) 800 MG tablet Take 1 tablet (800 mg total)  by mouth 3 (three) times daily. 08/24/13   Danielle Palumbo, MD  lisinopril (PRINIVIL,ZESTRIL) 10 MG tablet Take 1 tablet (10 mg total) by mouth daily. 04/11/15   Danielle Hacker, MD  metFORMIN (GLUCOPHAGE) 1000 MG tablet Take 1 tablet (1,000 mg total) by mouth daily with breakfast. 04/11/15   Danielle Hacker, MD  naproxen (NAPROSYN) 500 MG tablet  04/21/13   Historical Provider, MD  pantoprazole (PROTONIX) 20 MG tablet Take 20 mg by mouth daily.    Historical Provider, MD   Triage Vitals:  BP 123/86 mmHg  Pulse 91  Temp(Src) 98 F (36.7 C) (Oral)  Resp 18  Ht 5\' 9"  (1.753 m)  Wt 238 lb (107.956 kg)  BMI 35.13 kg/m2  SpO2 98%  LMP 04/09/2015   Physical Exam  Constitutional: She is oriented to person, place, and time. She appears well-developed and well-nourished.   obese  HENT:  Head: Normocephalic and atraumatic.  Eyes: EOM are normal. Pupils are equal, round, and reactive to light.  Cardiovascular: Normal rate, regular rhythm and normal heart sounds.   No murmur heard. Pulmonary/Chest: Effort normal and breath sounds normal. No respiratory distress. She has no wheezes.  Abdominal: Soft. Bowel sounds are normal. There is no tenderness. There is no rebound.  Neurological: She is alert and oriented to person, place, and time.   Cranial nerves II through XII intact, 5 out of 5 strength in all 4 extremities,  No dysmetria to finger-nose-finger  Skin: Skin is warm and dry.  Psychiatric: She has a normal mood and affect.  Nursing note and vitals reviewed.   ED Course  Procedures (including critical care time)  DIAGNOSTIC STUDIES: Oxygen Saturation is 98% on RA, Normal by my interpretation.    COORDINATION OF CARE:    Labs Review Labs Reviewed  URINALYSIS, ROUTINE W REFLEX MICROSCOPIC (NOT AT [facility]) - Abnormal; Notable for the following:    Specific Gravity, Urine 1.040 (*)    Glucose, UA >1000 (*)    Hgb urine dipstick LARGE (*)    Ketones, ur 15 (*)    All other components within normal limits  URINE MICROSCOPIC-ADD ON - Abnormal; Notable for the following:    Squamous Epithelial / LPF 0-5 (*)    Bacteria, UA FEW (*)    Casts HYALINE CASTS (*)    All other components within normal limits  CBC WITH DIFFERENTIAL/PLATELET - Abnormal; Notable for the following:    Hemoglobin 9.6 (*)    HCT 31.2 (*)    MCV 67.5 (*)    MCH 20.8 (*)    All other components within normal limits  BASIC METABOLIC PANEL - Abnormal; Notable for the following:    Sodium 132 (*)     CO2 21 (*)    Glucose, Bld 365 (*)    Calcium 8.3 (*)    All other components within normal limits  CBG MONITORING, ED - Abnormal; Notable for the following:    Glucose-Capillary 326 (*)    All other components within normal limits    Imaging Review No results found. I have personally reviewed and evaluated these images and lab results as part of my medical decision-making.   EKG Interpretation None      MDM   Final diagnoses:  Hyperglycemia  Essential hypertension     patient presents with complaints of hypertension and hyperglycemia. Symptoms have been ongoing an recurrent over the last 3-4 months as the patient has been out of her medication. Currently patient reports a  headache and feeling "shaky. She is neurologically intact. Blood pressure is 141/84. She denies chest pain or shortness of breath. Basic labwork obtained.   Blood sugar is 326 without an anion gap. Otherwise patient is mildly anemic. Other basic labwork is reassuring.   Patient was given fluids and metformin. There does not appear to be any signs or symptoms of hypertensive urgency or emergency. She does not appear to be in DKA. Will place patient back on metformin, hydrochlorothiazide, and lisinopril. She was given information for [facility] follow-up.  After history, exam, and medical workup I feel the patient has been appropriately medically screened and is safe for discharge home. Pertinent diagnoses were discussed with the patient. Patient was given return precautions.  I personally performed the services described in this documentation, which was scribed in my presence. The recorded information has been reviewed and is accurate.   Danielle Hacker, MD 04/11/15 [phone]

## 2015-04-11 NOTE — Discharge Instructions (Signed)
Hypertension Hypertension, commonly called high blood pressure, is when the force of blood pumping through your arteries is too strong. Your arteries are the blood vessels that carry blood from your heart throughout your body. A blood pressure reading consists of a higher number over a lower number, such as 110/72. The higher number (systolic) is the pressure inside your arteries when your heart pumps. The lower number (diastolic) is the pressure inside your arteries when your heart relaxes. Ideally you want your blood pressure below 120/80. Hypertension forces your heart to work harder to pump blood. Your arteries may become narrow or stiff. Having untreated or uncontrolled hypertension can cause heart attack, stroke, kidney disease, and other problems. RISK FACTORS Some risk factors for high blood pressure are controllable. Others are not.  Risk factors you cannot control include:   Race. You may be at higher risk if you are African American.  Age. Risk increases with age.  Gender. Men are at higher risk than women before age 45 years. After age 65, women are at higher risk than men. Risk factors you can control include:  Not getting enough exercise or physical activity.  Being overweight.  Getting too much fat, sugar, calories, or salt in your diet.  Drinking too much alcohol. SIGNS AND SYMPTOMS Hypertension does not usually cause signs or symptoms. Extremely high blood pressure (hypertensive crisis) may cause headache, anxiety, shortness of breath, and nosebleed. DIAGNOSIS To check if you have hypertension, your health care provider will measure your blood pressure while you are seated, with your arm held at the level of your heart. It should be measured at least twice using the same arm. Certain conditions can cause a difference in blood pressure between your right and left arms. A blood pressure reading that is higher than normal on one occasion does not mean that you need treatment. If  it is not clear whether you have high blood pressure, you may be asked to return on a different day to have your blood pressure checked again. Or, you may be asked to monitor your blood pressure at home for 1 or more weeks. TREATMENT Treating high blood pressure includes making lifestyle changes and possibly taking medicine. Living a healthy lifestyle can help lower high blood pressure. You may need to change some of your habits. Lifestyle changes may include:  Following the DASH diet. This diet is high in fruits, vegetables, and whole grains. It is low in salt, red meat, and added sugars.  Keep your sodium intake below 2,300 mg per day.  Getting at least 30-45 minutes of aerobic exercise at least 4 times per week.  Losing weight if necessary.  Not smoking.  Limiting alcoholic beverages.  Learning ways to reduce stress. Your health care provider may prescribe medicine if lifestyle changes are not enough to get your blood pressure under control, and if one of the following is true:  You are 18-59 years of age and your systolic blood pressure is above 140.  You are 60 years of age or older, and your systolic blood pressure is above 150.  Your diastolic blood pressure is above 90.  You have diabetes, and your systolic blood pressure is over 140 or your diastolic blood pressure is over 90.  You have kidney disease and your blood pressure is above 140/90.  You have heart disease and your blood pressure is above 140/90. Your personal target blood pressure may vary depending on your medical conditions, your age, and other factors. HOME CARE INSTRUCTIONS    Have your blood pressure rechecked as directed by your health care provider.   °· Take medicines only as directed by your health care provider. Follow the directions carefully. Blood pressure medicines must be taken as prescribed. The medicine does not work as well when you skip doses. Skipping doses also puts you at risk for  problems. °· Do not smoke.   °· Monitor your blood pressure at home as directed by your health care provider.  °SEEK MEDICAL CARE IF:  °· You think you are having a reaction to medicines taken. °· You have recurrent headaches or feel dizzy. °· You have swelling in your ankles. °· You have trouble with your vision. °SEEK IMMEDIATE MEDICAL CARE IF: °· You develop a severe headache or confusion. °· You have unusual weakness, numbness, or feel faint. °· You have severe chest or abdominal pain. °· You vomit repeatedly. °· You have trouble breathing. °MAKE SURE YOU:  °· Understand these instructions. °· Will watch your condition. °· Will get help right away if you are not doing well or get worse. °  °This information is not intended to replace advice given to you by your health care provider. Make sure you discuss any questions you have with your health care provider. °  °Document Released: 03/24/2005 Document Revised: 08/08/2014 Document Reviewed: 01/14/2013 °Elsevier Interactive Patient Education ©2016 [employer] °Hyperglycemia °Hyperglycemia occurs when the glucose (sugar) in your blood is too high. Hyperglycemia can happen for many reasons, but it most often happens to people who do not know they have diabetes or are not managing their diabetes properly.  °CAUSES  °Whether you have diabetes or not, there are other causes of hyperglycemia. Hyperglycemia can occur when you have diabetes, but it can also occur in other situations that you might not be as aware of, such as: °Diabetes °· If you have diabetes and are having problems controlling your blood glucose, hyperglycemia could occur because of some of the following reasons: °· Not following your meal plan. °· Not taking your diabetes medications or not taking it properly. °· Exercising less or doing less activity than you normally do. °· Being sick. °Pre-diabetes °· This cannot be ignored. Before people develop Type 2 diabetes, they almost always have  "pre-diabetes." This is when your blood glucose levels are higher than normal, but not yet high enough to be diagnosed as diabetes. Research has shown that some long-term damage to the body, especially the heart and circulatory system, may already be occurring during pre-diabetes. If you take action to manage your blood glucose when you have pre-diabetes, you may delay or prevent Type 2 diabetes from developing. °Stress °· If you have diabetes, you may be "diet" controlled or on oral medications or insulin to control your diabetes. However, you may find that your blood glucose is higher than usual in the hospital whether you have diabetes or not. This is often referred to as "stress hyperglycemia." Stress can elevate your blood glucose. This happens because of hormones put out by the body during times of stress. If stress has been the cause of your high blood glucose, it can be followed regularly by your caregiver. That way he/she can make sure your hyperglycemia does not continue to get worse or progress to diabetes. °Steroids °· Steroids are medications that act on the infection fighting system (immune system) to block inflammation or infection. One side effect can be a rise in blood glucose. Most people can produce enough extra insulin to allow for this rise, but for those   who cannot, steroids make blood glucose levels go even higher. It is not unusual for steroid treatments to "uncover" diabetes that is developing. It is not always possible to determine if the hyperglycemia will go away after the steroids are stopped. A special blood test called an A1c is sometimes done to determine if your blood glucose was elevated before the steroids were started. °SYMPTOMS °· Thirsty. °· Frequent urination. °· Dry mouth. °· Blurred vision. °· Tired or fatigue. °· Weakness. °· Sleepy. °· Tingling in feet or leg. °DIAGNOSIS  °Diagnosis is made by monitoring blood glucose in one or all of the following ways: °· A1c test. This  is a chemical found in your blood. °· Fingerstick blood glucose monitoring. °· Laboratory results. °TREATMENT  °First, knowing the cause of the hyperglycemia is important before the hyperglycemia can be treated. Treatment may include, but is not be limited to: °· Education. °· Change or adjustment in medications. °· Change or adjustment in meal plan. °· Treatment for an illness, infection, etc. °· More frequent blood glucose monitoring. °· Change in exercise plan. °· Decreasing or stopping steroids. °· Lifestyle changes. °HOME CARE INSTRUCTIONS  °· Test your blood glucose as directed. °· Exercise regularly. Your caregiver will give you instructions about exercise. Pre-diabetes or diabetes which comes on with stress is helped by exercising. °· Eat wholesome, balanced meals. Eat often and at regular, fixed times. Your caregiver or nutritionist will give you a meal plan to guide your sugar intake. °· Being at an ideal weight is important. If needed, losing as little as 10 to 15 pounds may help improve blood glucose levels. °SEEK MEDICAL CARE IF:  °· You have questions about medicine, activity, or diet. °· You continue to have symptoms (problems such as increased thirst, urination, or weight gain). °SEEK IMMEDIATE MEDICAL CARE IF:  °· You are vomiting or have diarrhea. °· Your breath smells fruity. °· You are breathing faster or slower. °· You are very sleepy or incoherent. °· You have numbness, tingling, or pain in your feet or hands. °· You have chest pain. °· Your symptoms get worse even though you have been following your caregiver's orders. °· If you have any other questions or concerns. °  °This information is not intended to replace advice given to you by your health care provider. Make sure you discuss any questions you have with your health care provider. °  °Document Released: 09/17/2000 Document Revised: 06/16/2011 Document Reviewed: 11/28/2014 °Elsevier Interactive Patient Education ©2016 [employer] ° °

## 2017-04-29 ENCOUNTER — Emergency Department (HOSPITAL_BASED_OUTPATIENT_CLINIC_OR_DEPARTMENT_OTHER)
Admission: EM | Admit: 2017-04-29 | Discharge: 2017-04-29 | Disposition: A | Discharge status: Home / Self Care | Payer: BLUE CROSS/BLUE SHIELD | Attending: Emergency Medicine | Admitting: Emergency Medicine

## 2017-04-29 ENCOUNTER — Encounter (HOSPITAL_BASED_OUTPATIENT_CLINIC_OR_DEPARTMENT_OTHER): Payer: Self-pay

## 2017-04-29 ENCOUNTER — Other Ambulatory Visit: Payer: Self-pay

## 2017-04-29 DIAGNOSIS — I10 Essential (primary) hypertension: Secondary | ICD-10-CM | POA: Insufficient documentation

## 2017-04-29 DIAGNOSIS — Z87891 Personal history of nicotine dependence: Secondary | ICD-10-CM | POA: Insufficient documentation

## 2017-04-29 DIAGNOSIS — R69 Illness, unspecified: Secondary | ICD-10-CM

## 2017-04-29 DIAGNOSIS — J111 Influenza due to unidentified influenza virus with other respiratory manifestations: Secondary | ICD-10-CM | POA: Insufficient documentation

## 2017-04-29 DIAGNOSIS — Z7984 Long term (current) use of oral hypoglycemic drugs: Secondary | ICD-10-CM | POA: Insufficient documentation

## 2017-04-29 DIAGNOSIS — E119 Type 2 diabetes mellitus without complications: Secondary | ICD-10-CM | POA: Insufficient documentation

## 2017-04-29 DIAGNOSIS — Z79899 Other long term (current) drug therapy: Secondary | ICD-10-CM | POA: Insufficient documentation

## 2017-04-29 MED ORDER — HYDROCOD POLST-CPM POLST ER 10-8 MG/5ML PO SUER: 5.0000 mL | Freq: Two times a day (BID) | ORAL | 0 refills | Status: DC | PRN

## 2017-04-29 MED ORDER — ALBUTEROL SULFATE HFA 108 (90 BASE) MCG/ACT IN AERS
2.0000 | INHALATION_SPRAY | RESPIRATORY_TRACT | Status: DC | PRN
  Administered 2017-04-29: 2 via RESPIRATORY_TRACT
  Filled 2017-04-29: qty 6.7

## 2017-04-29 NOTE — ED Triage Notes (Signed)
Pt reports congestion, cough, throat pain, body aches and fevers at home since Saturday. Pt has been taking tylenol/motrin and using honey and lemon for her throat. Pt has not received a flu shot this year.

## 2017-04-29 NOTE — ED Provider Notes (Signed)
[facility] Provider Note: [name], MD, FACEP  CSN: [id number] MRN: [id number] ARRIVAL: 04/29/17 at [place]: [place]  Influenza   HISTORY OF PRESENT ILLNESS  04/29/17 4:34 AM Danielle Mendez is a 42 y.o. female with a 4-day history of influenza-like symptoms.  Specifically she has had nasal congestion, productive cough, shortness of breath, sore throat, body aches and fever.  She rates her body aches as a 6 out of 10.  She has been taking ibuprofen and acetaminophen for her fever and aches without adequate relief.  She has been using honey and lemon for her throat.  Her fevers been as high as 103 early in the course of her illness.   Past Medical History:  Diagnosis Date  . Diabetes mellitus without complication ([place])   . GERD (gastroesophageal reflux disease)   . Hypercholesteremia   . Hypertension     History reviewed. No pertinent surgical history.  No family history on file.  Social History   Tobacco Use  . Smoking status: Former Smoker    Last attempt to quit: 04/14/2013    Years since quitting: 4.0  Substance Use Topics  . Alcohol use: No  . Drug use: No    Prior to Admission medications   Medication Sig Start Date End Date Taking? Authorizing Provider  amLODipine (NORVASC) 10 MG tablet Take 10 mg by mouth daily.   Yes [provider]  atorvastatin (LIPITOR) 10 MG tablet Take 10 mg by mouth daily.   Yes [provider]  Fe Fum-FePoly-Vit C-Vit B3 (INTEGRA) 62.5-62.5-40-3 MG CAPS Take one daily  Give generic 05/03/14  Yes Pfeiffer, Jeannie Done, MD  gabapentin (NEURONTIN) 300 MG capsule Take 300 mg by mouth 3 (three) times daily.   Yes [provider]  glipiZIDE (GLUCOTROL) 10 MG tablet Take one tablet in am and 1/2 tab before dinner 03/14/14  Yes Schoenhoff, Altamese Cabal, MD  ibuprofen (ADVIL,MOTRIN) 800 MG tablet Take 1 tablet (800 mg total) by mouth 3 (three) times daily. 08/24/13  Yes Palumbo, April, MD    lisinopril (PRINIVIL,ZESTRIL) 10 MG tablet Take 1 tablet (10 mg total) by mouth daily. 04/11/15  Yes Horton, Barbette Hair, MD  metFORMIN (GLUCOPHAGE) 1000 MG tablet Take 1 tablet (1,000 mg total) by mouth daily with breakfast. 04/11/15  Yes Horton, Barbette Hair, MD  naproxen (NAPROSYN) 500 MG tablet  04/21/13  Yes [provider]  pantoprazole (PROTONIX) 20 MG tablet Take 20 mg by mouth daily.   Yes [provider]  hydrochlorothiazide (HYDRODIURIL) 25 MG tablet Take 1 tablet (25 mg total) by mouth daily. 04/11/15   Horton, Barbette Hair, MD    Allergies Bactrim [sulfamethoxazole-trimethoprim]   REVIEW OF SYSTEMS  Negative except as noted here or in the History of Present Illness.   PHYSICAL EXAMINATION  Initial Vital Signs Blood pressure (!) 154/89, pulse 98, temperature 98.4 F (36.9 C), temperature source Oral, resp. rate 17, height 5\' 7"  (1.702 m), weight 113.4 kg (250 lb), last menstrual period 04/16/2017, SpO2 99 %.  Examination General: Well-developed, well-nourished female in no acute distress; appearance consistent with age of record HENT: normocephalic; atraumatic; nasal congestion; pharynx normal Eyes: pupils equal, round and reactive to light; extraocular muscles intact Neck: supple Heart: regular rate and rhythm Lungs: clear to auscultation bilaterally but shallow breaths with coughing on deep breaths Abdomen: soft; nondistended; nontender; bowel sounds present Extremities: No deformity; full range of motion; pulses normal Neurologic: Awake, alert and oriented; motor function  intact in all extremities and symmetric; no facial droop Skin: Warm and dry Psychiatric: Normal mood and affect   RESULTS  Summary of this visit's results, reviewed by myself:   EKG Interpretation  Date/Time:    Ventricular Rate:    PR Interval:    QRS Duration:   QT Interval:    QTC Calculation:   R Axis:     Text Interpretation:        Laboratory Studies: No results found  for this or any previous visit (from the past 24 hour(s)). Imaging Studies: No results found.  ED COURSE  Nursing notes and initial vitals signs, including pulse oximetry, reviewed.  Vitals:   04/29/17 0428  BP: (!) 154/89  Pulse: 98  Resp: 17  Temp: 98.4 F (36.9 C)  TempSrc: Oral  SpO2: 99%  Weight: 113.4 kg (250 lb)  Height: 5\' 7"  (1.702 m)   Symptoms consistent with influenza or influenza-like illness.  Patient given inhaler and instructed in its use.  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  PROCEDURES    ED DIAGNOSES     ICD-10-CM   1. Influenza-like illness R69        Michaelah Credeur, Jenny Reichmann, MD 04/29/17 [phone]

## 2017-08-12 ENCOUNTER — Other Ambulatory Visit: Payer: Self-pay

## 2017-08-12 ENCOUNTER — Encounter (HOSPITAL_BASED_OUTPATIENT_CLINIC_OR_DEPARTMENT_OTHER): Payer: Self-pay

## 2017-08-12 ENCOUNTER — Emergency Department (HOSPITAL_BASED_OUTPATIENT_CLINIC_OR_DEPARTMENT_OTHER)
Admission: EM | Admit: 2017-08-12 | Discharge: 2017-08-13 | Disposition: A | Discharge status: Home / Self Care | Payer: Self-pay | Attending: Emergency Medicine | Admitting: Emergency Medicine

## 2017-08-12 DIAGNOSIS — Z7984 Long term (current) use of oral hypoglycemic drugs: Secondary | ICD-10-CM | POA: Insufficient documentation

## 2017-08-12 DIAGNOSIS — E119 Type 2 diabetes mellitus without complications: Secondary | ICD-10-CM | POA: Insufficient documentation

## 2017-08-12 DIAGNOSIS — Z87891 Personal history of nicotine dependence: Secondary | ICD-10-CM | POA: Insufficient documentation

## 2017-08-12 DIAGNOSIS — R112 Nausea with vomiting, unspecified: Secondary | ICD-10-CM | POA: Insufficient documentation

## 2017-08-12 DIAGNOSIS — R197 Diarrhea, unspecified: Secondary | ICD-10-CM | POA: Insufficient documentation

## 2017-08-12 DIAGNOSIS — I10 Essential (primary) hypertension: Secondary | ICD-10-CM | POA: Insufficient documentation

## 2017-08-12 DIAGNOSIS — Z79899 Other long term (current) drug therapy: Secondary | ICD-10-CM | POA: Insufficient documentation

## 2017-08-12 LAB — CBG MONITORING, ED: GLUCOSE-CAPILLARY: 184 mg/dL — AB (ref 65–99)

## 2017-08-12 NOTE — ED Triage Notes (Signed)
C/o n/v/d x 3 days-NAD-steady gait 

## 2017-08-13 LAB — URINALYSIS, ROUTINE W REFLEX MICROSCOPIC
Bilirubin Urine: NEGATIVE
Glucose, UA: NEGATIVE mg/dL
Hgb urine dipstick: NEGATIVE
KETONES UR: NEGATIVE mg/dL
LEUKOCYTES UA: NEGATIVE
NITRITE: NEGATIVE
PH: 5.5 (ref 5.0–8.0)
PROTEIN: NEGATIVE mg/dL
Specific Gravity, Urine: >1.03 — ABNORMAL HIGH (ref 1.005–1.030)

## 2017-08-13 LAB — COMPREHENSIVE METABOLIC PANEL
ALBUMIN: 3.5 g/dL (ref 3.5–5.0)
ALT: 20 U/L (ref 14–54)
AST: 21 U/L (ref 15–41)
Alkaline Phosphatase: 61 U/L (ref 38–126)
Anion gap: 13 (ref 5–15)
BILIRUBIN TOTAL: 0.4 mg/dL (ref 0.3–1.2)
BUN: 12 mg/dL (ref 6–20)
CO2: 25 mmol/L (ref 22–32)
CREATININE: 0.82 mg/dL (ref 0.44–1.00)
Calcium: 8.7 mg/dL — ABNORMAL LOW (ref 8.9–10.3)
Chloride: 100 mmol/L — ABNORMAL LOW (ref 101–111)
GFR calc Af Amer: >60 mL/min (ref >60)
GFR calc non Af Amer: >60 mL/min (ref >60)
GLUCOSE: 170 mg/dL — AB (ref 65–99)
Potassium: 3.1 mmol/L — ABNORMAL LOW (ref 3.5–5.1)
SODIUM: 138 mmol/L (ref 135–145)
Total Protein: 7.4 g/dL (ref 6.5–8.1)

## 2017-08-13 LAB — CBC
HEMATOCRIT: 32.4 % — AB (ref 36.0–46.0)
Hemoglobin: 10.1 g/dL — ABNORMAL LOW (ref 12.0–15.0)
MCH: 19.3 pg — AB (ref 26.0–34.0)
MCHC: 31.2 g/dL (ref 30.0–36.0)
MCV: 62.1 fL — ABNORMAL LOW (ref 78.0–100.0)
Platelets: 388 10*3/uL (ref 150–400)
RBC: 5.22 MIL/uL — AB (ref 3.87–5.11)
RDW: 18 % — ABNORMAL HIGH (ref 11.5–15.5)
WBC: 8.9 10*3/uL (ref 4.0–10.5)

## 2017-08-13 LAB — PREGNANCY, URINE: Preg Test, Ur: NEGATIVE

## 2017-08-13 LAB — LIPASE, BLOOD: Lipase: 31 U/L (ref 11–51)

## 2017-08-13 MED ORDER — LOPERAMIDE HCL 2 MG PO CAPS
4.0000 mg | ORAL_CAPSULE | Freq: Once | ORAL | Status: AC
  Administered 2017-08-13: 4 mg via ORAL
  Filled 2017-08-13: qty 2

## 2017-08-13 MED ORDER — DICYCLOMINE HCL 20 MG PO TABS: 20.0000 mg | ORAL_TABLET | Freq: Three times a day (TID) | ORAL | 0 refills | Status: DC | PRN

## 2017-08-13 MED ORDER — DICYCLOMINE HCL 10 MG PO CAPS
20.0000 mg | ORAL_CAPSULE | Freq: Once | ORAL | Status: AC
  Administered 2017-08-13: 20 mg via ORAL
  Filled 2017-08-13: qty 2

## 2017-08-13 MED ORDER — SODIUM CHLORIDE 0.9 % IV BOLUS
1000.0000 mL | Freq: Once | INTRAVENOUS | Status: AC
  Administered 2017-08-13: 1000 mL via INTRAVENOUS

## 2017-08-13 MED ORDER — ONDANSETRON HCL 4 MG/2ML IJ SOLN
4.0000 mg | Freq: Once | INTRAMUSCULAR | Status: AC
  Administered 2017-08-13: 4 mg via INTRAVENOUS
  Filled 2017-08-13: qty 2

## 2017-08-13 MED ORDER — ONDANSETRON 4 MG PO TBDP: 4.0000 mg | ORAL_TABLET | Freq: Three times a day (TID) | ORAL | 0 refills | Status: DC | PRN

## 2017-08-13 MED ORDER — LOPERAMIDE HCL 2 MG PO CAPS
2.0000 mg | ORAL_CAPSULE | Freq: Four times a day (QID) | ORAL | 0 refills | Status: AC | PRN
Start: 2017-08-13 — End: ?

## 2017-08-13 NOTE — ED Notes (Signed)
Pt given gingerale for PO challenge 

## 2017-08-13 NOTE — ED Provider Notes (Signed)
TIME SEEN: 12:16 AM  CHIEF COMPLAINT: Nausea, vomiting, diarrhea  HPI: Patient is a 42 year old female with history of hypertension, hyperlipidemia, diabetes who presents to the emergency department with intermittent lower abdominal cramping, nausea, vomiting and diarrhea that started 3 days ago.  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  No known sick contacts however.  She thinks she may have been exposed to bad food several days ago.  No known fever.  Denies any abdominal surgery.  ROS: See HPI Constitutional: no fever  Eyes: no drainage  ENT: no runny nose   Cardiovascular:  no chest pain  Resp: no SOB  GI: Vomiting and diarrhea GU: no dysuria Integumentary: no rash  Allergy: no hives  Musculoskeletal: no leg swelling  Neurological: no slurred speech ROS otherwise negative  PAST MEDICAL HISTORY/PAST SURGICAL HISTORY:  Past Medical History:  Diagnosis Date  . Diabetes mellitus without complication ([place])   . GERD (gastroesophageal reflux disease)   . Hypercholesteremia   . Hypertension     MEDICATIONS:  Prior to Admission medications   Medication Sig Start Date End Date Taking? Authorizing Provider  Liraglutide (VICTOZA [place]) Inject into the skin.   Yes [provider]  sitaGLIPtin-metformin (JANUMET) 50-500 MG tablet Take 1 tablet by mouth 2 (two) times daily with a meal.   Yes [provider]  amLODipine (NORVASC) 10 MG tablet Take 10 mg by mouth daily.    [provider]  atorvastatin (LIPITOR) 10 MG tablet Take 10 mg by mouth daily.    [provider]  chlorpheniramine-HYDROcodone (TUSSIONEX PENNKINETIC ER) 10-8 MG/5ML SUER Take 5 mLs by mouth every 12 (twelve) hours as needed. 04/29/17   Molpus, John, MD  Fe Fum-FePoly-Vit C-Vit B3 (INTEGRA) 62.5-62.5-40-3 MG CAPS Take one daily  Give generic 05/03/14   Charlesetta Shanks, MD  gabapentin (NEURONTIN) 300 MG capsule Take 300 mg by mouth 3 (three) times daily.    [provider]   glipiZIDE (GLUCOTROL) 10 MG tablet Take one tablet in am and 1/2 tab before dinner 03/14/14   Schoenhoff, Altamese Cabal, MD  hydrochlorothiazide (HYDRODIURIL) 25 MG tablet Take 1 tablet (25 mg total) by mouth daily. 04/11/15   Horton, Barbette Hair, MD  ibuprofen (ADVIL,MOTRIN) 800 MG tablet Take 1 tablet (800 mg total) by mouth 3 (three) times daily. 08/24/13   Palumbo, April, MD  lisinopril (PRINIVIL,ZESTRIL) 10 MG tablet Take 1 tablet (10 mg total) by mouth daily. 04/11/15   Horton, Barbette Hair, MD  metFORMIN (GLUCOPHAGE) 1000 MG tablet Take 1 tablet (1,000 mg total) by mouth daily with breakfast. 04/11/15   Horton, Barbette Hair, MD  naproxen (NAPROSYN) 500 MG tablet  04/21/13   [provider]  pantoprazole (PROTONIX) 20 MG tablet Take 20 mg by mouth daily.    [provider]    ALLERGIES:  Allergies  Allergen Reactions  . Bactrim [Sulfamethoxazole-Trimethoprim]     SOCIAL HISTORY:  Social History   Tobacco Use  . Smoking status: Former Smoker    Last attempt to quit: 04/14/2013    Years since quitting: 4.3  . Smokeless tobacco: Never Used  Substance Use Topics  . Alcohol use: No    FAMILY HISTORY: No family history on file.  EXAM: BP 126/88 (BP Location: Left Arm)   Pulse (!) 119   Temp 98.2 F (36.8 C) (Oral)   Resp 18   Ht 5\' 7"  (1.702 m)   Wt 103.4 kg (228 lb)   LMP 07/04/2017   SpO2 100%  BMI 35.71 kg/m  CONSTITUTIONAL: Alert and oriented and responds appropriately to questions. Well-appearing; well-nourished HEAD: Normocephalic EYES: Conjunctivae clear, pupils appear equal, EOMI ENT: normal nose; moist mucous membranes NECK: Supple, no meningismus, no nuchal rigidity, no LAD  CARD: RRR; S1 and S2 appreciated; no murmurs, no clicks, no rubs, no gallops RESP: Normal chest excursion without splinting or tachypnea; breath sounds clear and equal bilaterally; no wheezes, no rhonchi, no rales, no hypoxia or respiratory distress, speaking full sentences ABD/GI:  Normal bowel sounds; non-distended; soft, non-tender, no rebound, no guarding, no peritoneal signs, no hepatosplenomegaly BACK:  The back appears normal and is non-tender to palpation, there is no CVA tenderness EXT: Normal ROM in all joints; non-tender to palpation; no edema; normal capillary refill; no cyanosis, no calf tenderness or swelling    SKIN: Normal color for age and race; warm; no rash NEURO: Moves all extremities equally PSYCH: The patient's mood and manner are appropriate. Grooming and personal hygiene are appropriate.  MEDICAL DECISION MAKING: Patient here with nausea, vomiting and diarrhea.  Abdominal exam benign.  Suspect viral gastroenteritis.  Labs obtained in triage are unremarkable.  She does have elevated specific gravity but no ketones.  She was initially tachycardic but this has improved.  Will give IV fluids for mild dehydration and treat symptomatically with Zofran, Bentyl, Imodium.  Have offered to obtain stool samples given she works in a nursing home but patient declines.  Given patient's benign exam doubt appendicitis, colitis, diverticulitis, bowel obstruction.   ED PROGRESS: Patient reports feeling much better.  She is requesting discharge home.  She has been able to drink without difficulty.  Has not had a bowel movement at all since being in the emergency department.  Have recommended close outpatient follow-up if symptoms are not improving.  Discharge with prescriptions of Zofran, Imodium and Bentyl.  Recommended bland diet and increase fluid intake at home.  At this time, I do not feel there is any life-threatening condition present. I have reviewed and discussed all results (EKG, imaging, lab, urine as appropriate) and exam findings with patient/family. I have reviewed nursing notes and appropriate previous records.  I feel the patient is safe to be discharged home without further emergent workup and can continue workup as an outpatient as needed. Discussed usual and  customary return precautions. Patient/family verbalize understanding and are comfortable with this plan.  Outpatient follow-up has been provided if needed. All questions have been answered.      Ward, Delice Bison, DO 08/13/17 [phone]

## 2017-12-17 ENCOUNTER — Encounter (HOSPITAL_BASED_OUTPATIENT_CLINIC_OR_DEPARTMENT_OTHER): Payer: Self-pay | Admitting: Emergency Medicine

## 2017-12-17 ENCOUNTER — Emergency Department (HOSPITAL_BASED_OUTPATIENT_CLINIC_OR_DEPARTMENT_OTHER)
Admission: EM | Admit: 2017-12-17 | Discharge: 2017-12-17 | Disposition: A | Discharge status: Home / Self Care | Payer: Medicaid Other | Attending: Emergency Medicine | Admitting: Emergency Medicine

## 2017-12-17 DIAGNOSIS — B349 Viral infection, unspecified: Secondary | ICD-10-CM | POA: Diagnosis not present

## 2017-12-17 DIAGNOSIS — Z87891 Personal history of nicotine dependence: Secondary | ICD-10-CM | POA: Diagnosis not present

## 2017-12-17 DIAGNOSIS — E119 Type 2 diabetes mellitus without complications: Secondary | ICD-10-CM | POA: Diagnosis not present

## 2017-12-17 DIAGNOSIS — R0981 Nasal congestion: Secondary | ICD-10-CM | POA: Diagnosis present

## 2017-12-17 DIAGNOSIS — Z79899 Other long term (current) drug therapy: Secondary | ICD-10-CM | POA: Diagnosis not present

## 2017-12-17 DIAGNOSIS — Z7984 Long term (current) use of oral hypoglycemic drugs: Secondary | ICD-10-CM | POA: Insufficient documentation

## 2017-12-17 DIAGNOSIS — I10 Essential (primary) hypertension: Secondary | ICD-10-CM | POA: Insufficient documentation

## 2017-12-17 MED ORDER — BENZONATATE 100 MG PO CAPS
ORAL_CAPSULE | ORAL | Status: AC
  Filled 2017-12-17: qty 2

## 2017-12-17 MED ORDER — PREDNISONE 10 MG PO TABS
ORAL_TABLET | ORAL | Status: AC
  Filled 2017-12-17: qty 1

## 2017-12-17 MED ORDER — PREDNISONE 50 MG PO TABS
ORAL_TABLET | ORAL | Status: AC
  Filled 2017-12-17: qty 1

## 2017-12-17 NOTE — ED Notes (Signed)
Pt seen during computer down time. See paper chart.

## 2017-12-17 NOTE — ED Provider Notes (Signed)
[facility] EMERGENCY DEPARTMENT Provider Note   CSN: [id number] Arrival date & time: 12/17/17  0058     History   Chief Complaint No chief complaint on file.   HPI Danielle Mendez is a 42 y.o. female.  The history is provided by the patient.  URI   This is a new problem. The current episode started more than 1 week ago. The problem has not changed since onset.There has been no fever. Associated symptoms include congestion, plugged ear sensation, rhinorrhea, sneezing and cough. Pertinent negatives include no chest pain, no abdominal pain, no diarrhea, no nausea, no vomiting, no sinus pain, no swollen glands, no joint pain and no wheezing. She has tried nothing for the symptoms. The treatment provided no relief.    Past Medical History:  Diagnosis Date  . Diabetes mellitus without complication ([place])   . GERD (gastroesophageal reflux disease)   . Hypercholesteremia   . Hypertension     Patient Active Problem List   Diagnosis Date Noted  . Type II or unspecified type diabetes mellitus without mention of complication, uncontrolled 04/19/2013  . HTN (hypertension) 04/19/2013  . Hyperlipidemia 04/19/2013  . Headache(784.0) 04/19/2013  . Menorrhagia 04/19/2013  . Pelvic pain 04/19/2013    History reviewed. No pertinent surgical history.   OB History   None      Home Medications    Prior to Admission medications   Medication Sig Start Date End Date Taking? Authorizing Provider  amLODipine (NORVASC) 10 MG tablet Take 10 mg by mouth daily.    [provider]  atorvastatin (LIPITOR) 10 MG tablet Take 10 mg by mouth daily.    [provider]  chlorpheniramine-HYDROcodone (TUSSIONEX PENNKINETIC ER) 10-8 MG/5ML SUER Take 5 mLs by mouth every 12 (twelve) hours as needed. 04/29/17   Molpus, Jenny Reichmann, MD  dicyclomine (BENTYL) 20 MG tablet Take 1 tablet (20 mg total) by mouth every 8 (eight) hours as needed for spasms (Abdominal cramping). 08/13/17   Ward,  Delice Bison, DO  Fe Fum-FePoly-Vit C-Vit B3 (INTEGRA) 62.5-62.5-40-3 MG CAPS Take one daily  Give generic 05/03/14   Charlesetta Shanks, MD  gabapentin (NEURONTIN) 300 MG capsule Take 300 mg by mouth 3 (three) times daily.    [provider]  glipiZIDE (GLUCOTROL) 10 MG tablet Take one tablet in am and 1/2 tab before dinner 03/14/14   Schoenhoff, Altamese Cabal, MD  hydrochlorothiazide (HYDRODIURIL) 25 MG tablet Take 1 tablet (25 mg total) by mouth daily. 04/11/15   Horton, Barbette Hair, MD  ibuprofen (ADVIL,MOTRIN) 800 MG tablet Take 1 tablet (800 mg total) by mouth 3 (three) times daily. 08/24/13   Jaelah Hauth, MD  Liraglutide (VICTOZA [place]) Inject into the skin.    [provider]  lisinopril (PRINIVIL,ZESTRIL) 10 MG tablet Take 1 tablet (10 mg total) by mouth daily. 04/11/15   Horton, Barbette Hair, MD  loperamide (IMODIUM) 2 MG capsule Take 1 capsule (2 mg total) by mouth 4 (four) times daily as needed for diarrhea or loose stools. 08/13/17   Ward, Delice Bison, DO  metFORMIN (GLUCOPHAGE) 1000 MG tablet Take 1 tablet (1,000 mg total) by mouth daily with breakfast. 04/11/15   Horton, Barbette Hair, MD  naproxen (NAPROSYN) 500 MG tablet  04/21/13   [provider]  ondansetron (ZOFRAN ODT) 4 MG disintegrating tablet Take 1 tablet (4 mg total) by mouth every 8 (eight) hours as needed for nausea or vomiting. 08/13/17   Ward, Delice Bison, DO  pantoprazole (PROTONIX) 20 MG tablet  Take 20 mg by mouth daily.    [provider]  sitaGLIPtin-metformin (JANUMET) 50-500 MG tablet Take 1 tablet by mouth 2 (two) times daily with a meal.    [provider]    Family History No family history on file.  Social History Social History   Tobacco Use  . Smoking status: Former Smoker    Last attempt to quit: 04/14/2013    Years since quitting: 4.6  . Smokeless tobacco: Never Used  Substance Use Topics  . Alcohol use: No  . Drug use: No     Allergies   Bactrim  [sulfamethoxazole-trimethoprim]   Review of Systems Review of Systems  Constitutional: Negative for diaphoresis, fatigue and fever.  HENT: Positive for congestion, rhinorrhea and sneezing. Negative for sinus pain.   Respiratory: Positive for cough. Negative for shortness of breath and wheezing.   Cardiovascular: Negative for chest pain, palpitations and leg swelling.  Gastrointestinal: Negative for abdominal pain, diarrhea, nausea and vomiting.  Musculoskeletal: Negative for joint pain.  All other systems reviewed and are negative.    Physical Exam Updated Vital Signs There were no vitals taken for this visit.  Physical Exam  Constitutional: She is oriented to person, place, and time. She appears well-developed and well-nourished. No distress.  HENT:  Head: Normocephalic and atraumatic.  Right Ear: External ear normal.  Left Ear: External ear normal.  Nose: Nose normal.  Mouth/Throat: Oropharynx is clear and moist. No oropharyngeal exudate.  Eyes: Pupils are equal, round, and reactive to light. Conjunctivae are normal.  Neck: Normal range of motion. Neck supple.  Cardiovascular: Normal rate, regular rhythm, normal heart sounds and intact distal pulses.  Pulmonary/Chest: Effort normal and breath sounds normal. No stridor. She has no wheezes. She has no rales.  Abdominal: Soft. Bowel sounds are normal. She exhibits no mass. There is no tenderness. There is no rebound and no guarding.  Musculoskeletal: Normal range of motion.  Lymphadenopathy:    She has no cervical adenopathy.  Neurological: She is alert and oriented to person, place, and time.  Skin: Skin is warm and dry. Capillary refill takes less than 2 seconds.  Psychiatric: She has a normal mood and affect.     ED Treatments / Results  Labs (all labs ordered are listed, but only abnormal results are displayed) Labs Reviewed - No data to display  EKG None  Radiology No results found.  Procedures Procedures  (including critical care time)  Medications Ordered in ED Medications  predniSONE (DELTASONE) 50 MG tablet (has no administration in time range)  predniSONE (DELTASONE) 10 MG tablet (has no administration in time range)  benzonatate (TESSALON) 100 MG capsule (has no administration in time range)      Final Clinical Impressions(s) / ED Diagnoses   Final diagnoses:  Viral illness   Return for fevers >100.4 unrelieved by medication, shortness of breath, intractable vomiting, or diarrhea, Inability to tolerate liquids or food, cough, altered mental status or any concerns. No signs of systemic illness or infection. The patient is nontoxic-appearing on exam and vital signs are within normal limits.   I have reviewed the triage vital signs and the nursing notes. Pertinent labs &imaging results that were available during my care of the patient were reviewed by me and considered in my medical decision making (see chart for details).  After history, exam, and medical workup I feel the patient has been appropriately medically screened and is safe for discharge home. Pertinent diagnoses were discussed with the patient. Patient  was given return precautions.   Khrystina Bonnes, MD 12/17/17 [phone]

## 2019-01-12 ENCOUNTER — Other Ambulatory Visit: Payer: Self-pay

## 2019-01-12 ENCOUNTER — Encounter (HOSPITAL_BASED_OUTPATIENT_CLINIC_OR_DEPARTMENT_OTHER): Payer: Self-pay | Admitting: *Deleted

## 2019-01-12 ENCOUNTER — Emergency Department (HOSPITAL_BASED_OUTPATIENT_CLINIC_OR_DEPARTMENT_OTHER)
Admission: EM | Admit: 2019-01-12 | Discharge: 2019-01-12 | Disposition: A | Discharge status: Home / Self Care | Payer: Medicaid Other | Attending: Emergency Medicine | Admitting: Emergency Medicine

## 2019-01-12 DIAGNOSIS — D259 Leiomyoma of uterus, unspecified: Secondary | ICD-10-CM | POA: Diagnosis not present

## 2019-01-12 DIAGNOSIS — N939 Abnormal uterine and vaginal bleeding, unspecified: Secondary | ICD-10-CM | POA: Diagnosis not present

## 2019-01-12 DIAGNOSIS — D649 Anemia, unspecified: Secondary | ICD-10-CM | POA: Insufficient documentation

## 2019-01-12 DIAGNOSIS — D500 Iron deficiency anemia secondary to blood loss (chronic): Secondary | ICD-10-CM

## 2019-01-12 HISTORY — DX: Leiomyoma of uterus, unspecified: D25.9

## 2019-01-12 LAB — CBC WITH DIFFERENTIAL/PLATELET
Abs Immature Granulocytes: 0.09 10*3/uL — ABNORMAL HIGH (ref 0.00–0.07)
Basophils Absolute: 0 10*3/uL (ref 0.0–0.1)
Basophils Relative: 0 %
Eosinophils Absolute: 0.1 10*3/uL (ref 0.0–0.5)
Eosinophils Relative: 1 %
HCT: 23.8 % — ABNORMAL LOW (ref 36.0–46.0)
Hemoglobin: 7.3 g/dL — ABNORMAL LOW (ref 12.0–15.0)
Immature Granulocytes: 1 %
Lymphocytes Relative: 18 %
Lymphs Abs: 2 10*3/uL (ref 0.7–4.0)
MCH: 23.1 pg — ABNORMAL LOW (ref 26.0–34.0)
MCHC: 30.7 g/dL (ref 30.0–36.0)
MCV: 75.3 fL — ABNORMAL LOW (ref 80.0–100.0)
Monocytes Absolute: 0.6 10*3/uL (ref 0.1–1.0)
Monocytes Relative: 5 %
Neutro Abs: 8.3 10*3/uL — ABNORMAL HIGH (ref 1.7–7.7)
Neutrophils Relative %: 75 %
Platelets: 299 10*3/uL (ref 150–400)
RBC: 3.16 MIL/uL — ABNORMAL LOW (ref 3.87–5.11)
RDW: 17.8 % — ABNORMAL HIGH (ref 11.5–15.5)
WBC: 11.1 10*3/uL — ABNORMAL HIGH (ref 4.0–10.5)
nRBC: 0 % (ref 0.0–0.2)

## 2019-01-12 LAB — URINALYSIS, ROUTINE W REFLEX MICROSCOPIC
Bilirubin Urine: NEGATIVE
Glucose, UA: >=500 mg/dL — AB
Ketones, ur: NEGATIVE mg/dL
Leukocytes,Ua: NEGATIVE
Nitrite: NEGATIVE
Protein, ur: NEGATIVE mg/dL
Specific Gravity, Urine: 1.025 (ref 1.005–1.030)
pH: 5.5 (ref 5.0–8.0)

## 2019-01-12 LAB — BASIC METABOLIC PANEL
Anion gap: 12 (ref 5–15)
BUN: 16 mg/dL (ref 6–20)
CO2: 25 mmol/L (ref 22–32)
Calcium: 8.6 mg/dL — ABNORMAL LOW (ref 8.9–10.3)
Chloride: 97 mmol/L — ABNORMAL LOW (ref 98–111)
Creatinine, Ser: 0.9 mg/dL (ref 0.44–1.00)
GFR calc Af Amer: >60 mL/min (ref >60)
GFR calc non Af Amer: >60 mL/min (ref >60)
Glucose, Bld: 273 mg/dL — ABNORMAL HIGH (ref 70–99)
Potassium: 3.5 mmol/L (ref 3.5–5.1)
Sodium: 134 mmol/L — ABNORMAL LOW (ref 135–145)

## 2019-01-12 LAB — URINALYSIS, MICROSCOPIC (REFLEX)

## 2019-01-12 LAB — RPR: RPR Ser Ql: NONREACTIVE

## 2019-01-12 LAB — HIV ANTIBODY (ROUTINE TESTING W REFLEX): HIV Screen 4th Generation wRfx: NONREACTIVE

## 2019-01-12 LAB — PREGNANCY, URINE: Preg Test, Ur: NEGATIVE

## 2019-01-12 MED ORDER — MEDROXYPROGESTERONE ACETATE 10 MG PO TABS: 10.0000 mg | ORAL_TABLET | Freq: Every day | ORAL | 0 refills | Status: DC

## 2019-01-12 MED ORDER — INTEGRA 62.5-62.5-40-3 MG PO CAPS: ORAL_CAPSULE | ORAL | 0 refills | Status: DC

## 2019-01-12 MED ORDER — MEDROXYPROGESTERONE ACETATE 10 MG PO TABS
10.0000 mg | ORAL_TABLET | Freq: Every day | ORAL | Status: DC
  Filled 2019-01-12: qty 1

## 2019-01-12 MED ORDER — INTEGRA 62.5-62.5-40-3 MG PO CAPS: ORAL_CAPSULE | ORAL | 0 refills | Status: AC

## 2019-01-12 NOTE — Discharge Instructions (Addendum)
Return if you are having any problems. 

## 2019-01-12 NOTE — ED Triage Notes (Signed)
Pt reports HTN and vaginal bleeding x 3 months.

## 2019-01-12 NOTE — ED Provider Notes (Signed)
[facility] EMERGENCY DEPARTMENT Provider Note   CSN: [id number] Arrival date & time: 01/12/19  0247    History   Chief Complaint Chief Complaint  Patient presents with  . Hypertension    HPI Danielle Mendez is a 43 y.o. female.   The history is provided by the patient.  Hypertension  She has history of hypertension, diabetes, GERD, uterine fibroids and comes in because of excessive vaginal bleeding.  She has been having heavy bleeding constantly for the last 3 months, but it got much worse tonight.  She states that her bleeding is too heavy for pads to contain it and she has been passing clots.  There has been some mild suprapubic cramping associated with this.  She did have a dilatation and curettage done last January for similar symptoms and pathology was reported to have been normal.  She is G3, P3, A0.  Past Medical History:  Diagnosis Date  . Diabetes mellitus without complication ([place])   . GERD (gastroesophageal reflux disease)   . Hypercholesteremia   . Hypertension   . Uterine fibroid     Patient Active Problem List   Diagnosis Date Noted  . Type II or unspecified type diabetes mellitus without mention of complication, uncontrolled 04/19/2013  . HTN (hypertension) 04/19/2013  . Hyperlipidemia 04/19/2013  . Headache(784.0) 04/19/2013  . Menorrhagia 04/19/2013  . Pelvic pain 04/19/2013    History reviewed. No pertinent surgical history.   OB History   No obstetric history on file.      Home Medications    Prior to Admission medications   Medication Sig Start Date End Date Taking? Authorizing Provider  amLODipine (NORVASC) 10 MG tablet Take 10 mg by mouth daily.    [provider]  atorvastatin (LIPITOR) 10 MG tablet Take 10 mg by mouth daily.    [provider]  chlorpheniramine-HYDROcodone (TUSSIONEX PENNKINETIC ER) 10-8 MG/5ML SUER Take 5 mLs by mouth every 12 (twelve) hours as needed. 04/29/17   Molpus, Jenny Reichmann, MD   dicyclomine (BENTYL) 20 MG tablet Take 1 tablet (20 mg total) by mouth every 8 (eight) hours as needed for spasms (Abdominal cramping). 08/13/17   Ward, Delice Bison, DO  Fe Fum-FePoly-Vit C-Vit B3 (INTEGRA) 62.5-62.5-40-3 MG CAPS Take one daily  Give generic 05/03/14   Charlesetta Shanks, MD  gabapentin (NEURONTIN) 300 MG capsule Take 300 mg by mouth 3 (three) times daily.    [provider]  glipiZIDE (GLUCOTROL) 10 MG tablet Take one tablet in am and 1/2 tab before dinner 03/14/14   Schoenhoff, Altamese Cabal, MD  hydrochlorothiazide (HYDRODIURIL) 25 MG tablet Take 1 tablet (25 mg total) by mouth daily. 04/11/15   Horton, Barbette Hair, MD  ibuprofen (ADVIL,MOTRIN) 800 MG tablet Take 1 tablet (800 mg total) by mouth 3 (three) times daily. 08/24/13   Palumbo, April, MD  Liraglutide (VICTOZA [place]) Inject into the skin.    [provider]  lisinopril (PRINIVIL,ZESTRIL) 10 MG tablet Take 1 tablet (10 mg total) by mouth daily. 04/11/15   Horton, Barbette Hair, MD  loperamide (IMODIUM) 2 MG capsule Take 1 capsule (2 mg total) by mouth 4 (four) times daily as needed for diarrhea or loose stools. 08/13/17   Ward, Delice Bison, DO  metFORMIN (GLUCOPHAGE) 1000 MG tablet Take 1 tablet (1,000 mg total) by mouth daily with breakfast. 04/11/15   Horton, Barbette Hair, MD  naproxen (NAPROSYN) 500 MG tablet  04/21/13   [provider]  ondansetron (ZOFRAN ODT) 4 MG disintegrating tablet  Take 1 tablet (4 mg total) by mouth every 8 (eight) hours as needed for nausea or vomiting. 08/13/17   Ward, Delice Bison, DO  pantoprazole (PROTONIX) 20 MG tablet Take 20 mg by mouth daily.    [provider]  sitaGLIPtin-metformin (JANUMET) 50-500 MG tablet Take 1 tablet by mouth 2 (two) times daily with a meal.    [provider]    Family History History reviewed. No pertinent family history.  Social History Social History   Tobacco Use  . Smoking status: Former Smoker    Quit date: 04/14/2013    Years since quitting:  5.7  . Smokeless tobacco: Never Used  Substance Use Topics  . Alcohol use: No  . Drug use: No     Allergies   Bactrim [sulfamethoxazole-trimethoprim]   Review of Systems Review of Systems  All other systems reviewed and are negative.    Physical Exam Updated Vital Signs BP 121/69 (BP Location: Right Arm)   Pulse 100   Temp 98.3 F (36.8 C) (Oral)   Resp 18   Ht 5\' 7"  (1.702 m)   Wt 99.8 kg   SpO2 100%   BMI 34.46 kg/m   Physical Exam Vitals signs and nursing note reviewed.    43 year old female, resting comfortably and in no acute distress. Vital signs are normal. Oxygen saturation is 100%, which is normal. Head is normocephalic and atraumatic. PERRLA, EOMI. Oropharynx is clear. Neck is nontender and supple without adenopathy or JVD. Back is nontender and there is no CVA tenderness. Lungs are clear without rales, wheezes, or rhonchi. Chest is nontender. Heart has regular rate and rhythm without murmur. Abdomen is soft, flat, nontender without masses or hepatosplenomegaly and peristalsis is normoactive. Pelvic: Normal external female genitalia.  Cervix has slight trickle of dark red blood coming through it.  Only small amount of blood present in the vaginal vault.  There are no adnexal masses.  Fundus is 12-14 weeks size and nontender. Extremities have no cyanosis or edema, full range of motion is present. Skin is warm and dry without rash. Neurologic: Mental status is normal, cranial nerves are intact, there are no motor or sensory deficits.  ED Treatments / Results  Labs (all labs ordered are listed, but only abnormal results are displayed) Labs Reviewed  BASIC METABOLIC PANEL - Abnormal; Notable for the following components:      Result Value   Sodium 134 (*)    Chloride 97 (*)    Glucose, Bld 273 (*)    Calcium 8.6 (*)    All other components within normal limits  CBC WITH DIFFERENTIAL/PLATELET - Abnormal; Notable for the following components:   WBC 11.1  (*)    RBC 3.16 (*)    Hemoglobin 7.3 (*)    HCT 23.8 (*)    MCV 75.3 (*)    MCH 23.1 (*)    RDW 17.8 (*)    Neutro Abs 8.3 (*)    Abs Immature Granulocytes 0.09 (*)    All other components within normal limits  URINALYSIS, ROUTINE W REFLEX MICROSCOPIC - Abnormal; Notable for the following components:   Glucose, UA >=500 (*)    Hgb urine dipstick LARGE (*)    All other components within normal limits  URINALYSIS, MICROSCOPIC (REFLEX) - Abnormal; Notable for the following components:   Bacteria, UA FEW (*)    All other components within normal limits  PREGNANCY, URINE  RPR  HIV ANTIBODY (ROUTINE TESTING W REFLEX)  HIV4GL SAVE  TUBE  GC/CHLAMYDIA PROBE AMP ([place]) NOT AT [facility]   Procedures Procedures   Medications Ordered in ED Medications  medroxyPROGESTERone (PROVERA) tablet 10 mg (has no administration in time range)     Initial Impression / Assessment and Plan / ED Course  I have reviewed the triage vital signs and the nursing notes.  Pertinent labs & imaging results that were available during my care of the patient were reviewed by me and considered in my medical decision making (see chart for details).  Menorrhagia.  Old records reviewed confirming dilatation and curettage in January for excessive bleeding and mucosa was noted to be normal on pathology.  She had a recent visit with her gynecologist relating desire for pregnancy and was advised to make sure that her diabetes and high blood pressure were properly controlled prior to considering pregnancy.  She does have history of anemia and with excessive bleeding for 3 months, will check hemoglobin as well as doing pelvic exam.  Pelvic exam is consistent with uterine fibroids.  Labs show significant drop in hemoglobin.  Hemoglobin today is 7.3 with slightly microcytic indices.  On October 27, 2018, hemoglobin was 11.6 at [facility].  Patient states that she has not been taking her iron pill because  she ran out.  She is given a new prescription for her iron and also given a prescription for a 5-day course of medroxyprogesterone.  She is requesting referral to a different gynecologist and she is referred to the women's outpatient clinic.  Final Clinical Impressions(s) / ED Diagnoses   Final diagnoses:  Abnormal uterine bleeding  Anemia due to blood loss  Uterine leiomyoma, unspecified location    ED Discharge Orders         Ordered    Fe Fum-FePoly-Vit C-Vit B3 (INTEGRA) 62.5-62.5-40-3 MG CAPS    Note to Pharmacy: Give generic   01/12/19 0504    medroxyPROGESTERone (PROVERA) 10 MG tablet  Daily     01/12/19 [id number]           Delora Fuel, MD [id number] [phone]

## 2019-01-14 LAB — GC/CHLAMYDIA PROBE AMP (~~LOC~~) NOT AT ARMC
Chlamydia: NEGATIVE
Neisseria Gonorrhea: NEGATIVE

## 2019-03-20 ENCOUNTER — Emergency Department (HOSPITAL_BASED_OUTPATIENT_CLINIC_OR_DEPARTMENT_OTHER)
Admission: EM | Admit: 2019-03-20 | Discharge: 2019-03-20 | Disposition: A | Discharge status: Home / Self Care | Payer: Medicaid Other | Attending: Emergency Medicine | Admitting: Emergency Medicine

## 2019-03-20 ENCOUNTER — Other Ambulatory Visit: Payer: Self-pay

## 2019-03-20 ENCOUNTER — Encounter (HOSPITAL_BASED_OUTPATIENT_CLINIC_OR_DEPARTMENT_OTHER): Payer: Self-pay | Admitting: Emergency Medicine

## 2019-03-20 DIAGNOSIS — I10 Essential (primary) hypertension: Secondary | ICD-10-CM | POA: Insufficient documentation

## 2019-03-20 DIAGNOSIS — E119 Type 2 diabetes mellitus without complications: Secondary | ICD-10-CM | POA: Diagnosis not present

## 2019-03-20 DIAGNOSIS — Z79899 Other long term (current) drug therapy: Secondary | ICD-10-CM | POA: Diagnosis not present

## 2019-03-20 DIAGNOSIS — R238 Other skin changes: Secondary | ICD-10-CM | POA: Diagnosis present

## 2019-03-20 DIAGNOSIS — Z87891 Personal history of nicotine dependence: Secondary | ICD-10-CM | POA: Insufficient documentation

## 2019-03-20 DIAGNOSIS — L97829 Non-pressure chronic ulcer of other part of left lower leg with unspecified severity: Secondary | ICD-10-CM | POA: Insufficient documentation

## 2019-03-20 DIAGNOSIS — L98499 Non-pressure chronic ulcer of skin of other sites with unspecified severity: Secondary | ICD-10-CM

## 2019-03-20 DIAGNOSIS — Z7984 Long term (current) use of oral hypoglycemic drugs: Secondary | ICD-10-CM | POA: Insufficient documentation

## 2019-03-20 MED ORDER — CEPHALEXIN 500 MG PO CAPS: 1000.0000 mg | ORAL_CAPSULE | Freq: Two times a day (BID) | ORAL | 0 refills | Status: DC

## 2019-03-20 MED ORDER — LIDOCAINE-EPINEPHRINE (PF) 2 %-1:200000 IJ SOLN
10.0000 mL | Freq: Once | INTRAMUSCULAR | Status: AC
  Administered 2019-03-20: 10 mL
  Filled 2019-03-20: qty 10

## 2019-03-20 NOTE — Discharge Instructions (Addendum)
It was our pleasure to provide your ER care today - we hope that you feel better.  Keep area very clean - change dressing/wash with soap and water 1-2 x/day.  Take antibiotic as prescribed.   Follow up with primary care doctor in 1-2 weeks if symptoms fail to improve/resolve.  Return to ER if worse, new symptoms, high fevers, spreading redness, severe pain, or other concern.

## 2019-03-20 NOTE — ED Provider Notes (Signed)
[facility] EMERGENCY DEPARTMENT Provider Note   CSN: [id number] Arrival date & time: 03/20/19  [id number]     History Chief Complaint  Patient presents with  . Wound Check    Danielle Mendez is a 43 y.o. female.  Patient c/o skin lesion to right lower leg above ankle for past 3 weeks. States started as small clear blister, approximately 1 cm diameter, and has persisted. No 'scab' over area with pus draining around edges. No hx same. Denies seeing insect bite or sting. Hx niddm. Denies same symptoms in past. Does not feel ill or sick. No polyuria or polydipsia. No nv. No fever or chills.   The history is provided by the patient.  Wound Check       Past Medical History:  Diagnosis Date  . Diabetes mellitus without complication ([place])   . GERD (gastroesophageal reflux disease)   . Hypercholesteremia   . Hypertension   . Uterine fibroid     Patient Active Problem List   Diagnosis Date Noted  . Type II or unspecified type diabetes mellitus without mention of complication, uncontrolled 04/19/2013  . HTN (hypertension) 04/19/2013  . Hyperlipidemia 04/19/2013  . Headache(784.0) 04/19/2013  . Menorrhagia 04/19/2013  . Pelvic pain 04/19/2013    History reviewed. No pertinent surgical history.   OB History   No obstetric history on file.     No family history on file.  Social History   Tobacco Use  . Smoking status: Former Smoker    Quit date: 04/14/2013    Years since quitting: 5.9  . Smokeless tobacco: Never Used  Substance Use Topics  . Alcohol use: No  . Drug use: No    Home Medications Prior to Admission medications   Medication Sig Start Date End Date Taking? Authorizing Provider  sitaGLIPtin (JANUVIA) 100 MG tablet Take by mouth. 10/27/18  Yes [provider]  amLODipine (NORVASC) 10 MG tablet Take 10 mg by mouth daily.    [provider]  atorvastatin (LIPITOR) 10 MG tablet Take 10 mg by mouth daily.    [provider]    chlorpheniramine-HYDROcodone (TUSSIONEX PENNKINETIC ER) 10-8 MG/5ML SUER Take 5 mLs by mouth every 12 (twelve) hours as needed. 04/29/17   Molpus, Jenny Reichmann, MD  dicyclomine (BENTYL) 20 MG tablet Take 1 tablet (20 mg total) by mouth every 8 (eight) hours as needed for spasms (Abdominal cramping). 08/13/17   Ward, Delice Bison, DO  Fe Fum-FePoly-Vit C-Vit B3 (INTEGRA) 62.5-62.5-40-3 MG CAPS Take one daily  Give generic 99991111   Delora Fuel, MD  gabapentin (NEURONTIN) 300 MG capsule Take 300 mg by mouth 3 (three) times daily.    [provider]  glipiZIDE (GLUCOTROL) 10 MG tablet Take one tablet in am and 1/2 tab before dinner 03/14/14   Schoenhoff, Altamese Cabal, MD  hydrochlorothiazide (HYDRODIURIL) 25 MG tablet Take 1 tablet (25 mg total) by mouth daily. 04/11/15   Horton, Barbette Hair, MD  ibuprofen (ADVIL,MOTRIN) 800 MG tablet Take 1 tablet (800 mg total) by mouth 3 (three) times daily. 08/24/13   Palumbo, April, MD  Liraglutide (VICTOZA [place]) Inject into the skin.    [provider]  lisinopril (PRINIVIL,ZESTRIL) 10 MG tablet Take 1 tablet (10 mg total) by mouth daily. 04/11/15   Horton, Barbette Hair, MD  loperamide (IMODIUM) 2 MG capsule Take 1 capsule (2 mg total) by mouth 4 (four) times daily as needed for diarrhea or loose stools. 08/13/17   Ward, Delice Bison, DO  medroxyPROGESTERone (PROVERA)  10 MG tablet Take 1 tablet (10 mg total) by mouth daily. [id number]   Delora Fuel, MD  metFORMIN (GLUCOPHAGE) 1000 MG tablet Take 1 tablet (1,000 mg total) by mouth daily with breakfast. 04/11/15   Horton, Barbette Hair, MD  naproxen (NAPROSYN) 500 MG tablet  04/21/13   [provider]  ondansetron (ZOFRAN ODT) 4 MG disintegrating tablet Take 1 tablet (4 mg total) by mouth every 8 (eight) hours as needed for nausea or vomiting. 08/13/17   Ward, Delice Bison, DO  pantoprazole (PROTONIX) 20 MG tablet Take 20 mg by mouth daily.    [provider]  sitaGLIPtin-metformin (JANUMET) 50-500 MG tablet Take 1 tablet  by mouth 2 (two) times daily with a meal.    [provider]    Allergies    Bactrim [sulfamethoxazole-trimethoprim]  Review of Systems   Review of Systems  Constitutional: Negative for chills and fever.  Cardiovascular: Negative for leg swelling.  Gastrointestinal: Negative for nausea and vomiting.  Endocrine: Negative for polyuria.  Skin:       Skin lesion RLE.     Physical Exam Updated Vital Signs BP 122/77 (BP Location: Right Arm)   Pulse 95   Temp 98.4 F (36.9 C) (Oral)   Resp 16   Ht 1.676 m (5\' 6" )   Wt 97.5 kg   LMP 03/20/2019   SpO2 100%   BMI 34.70 kg/m   Physical Exam Vitals and nursing note reviewed.  Constitutional:      Appearance: Normal appearance. She is well-developed.  HENT:     Head: Atraumatic.     Nose: Nose normal.     Mouth/Throat:     Mouth: Mucous membranes are moist.  Eyes:     General: No scleral icterus.    Conjunctiva/sclera: Conjunctivae normal.  Neck:     Trachea: No tracheal deviation.  Cardiovascular:     Rate and Rhythm: Normal rate.     Pulses: Normal pulses.  Pulmonary:     Effort: Pulmonary effort is normal. No respiratory distress.  Abdominal:     General: There is no distension.  Genitourinary:    Comments: No cva tenderness.  Musculoskeletal:        General: No swelling.     Cervical back: Neck supple. No muscular tenderness.     Right lower leg: No edema.  Skin:    General: Skin is warm and dry.     Findings: No rash.     Comments: Approximately 1 cm diameter scab RLE above ankle with purulent drainage around edges, slight fluctuance, w mild surrounding erythema. Distal pulses palp. No leg or calf swelling or tenderness.   Neurological:     Mental Status: She is alert.     Comments: Alert, speech normal.   Psychiatric:        Mood and Affect: Mood normal.     ED Results / Procedures / Treatments   Labs (all labs ordered are listed, but only abnormal results are displayed) Labs Reviewed - No  data to display  EKG None  Radiology No results found.  Procedures Procedures (including critical care time)  Medications Ordered in ED Medications  lidocaine-EPINEPHrine (XYLOCAINE W/EPI) 2 %-1:200000 (PF) injection 10 mL (has no administration in time range)    ED Course  I have reviewed the triage vital signs and the nursing notes.  Pertinent labs & imaging results that were available during my care of the patient were reviewed by me and considered in my medical decision  making (see chart for details).    MDM Rules/Calculators/A&P   Reviewed nursing notes and prior charts for additional history.   I and D - infiltrated around area with lidocaine w epi.   Scab removed, pus drained, wound cleaned. Bacitracin and sterile dressing applied.   For mild surrounding erythema/?early cellulitis, will place on abx.   Rec pcp follow up.  Return precautions provided.    Final Clinical Impression(s) / ED Diagnoses Final diagnoses:  None    Rx / DC Orders ED Discharge Orders    None       Lajean Saver, MD 03/20/19 [id number]

## 2019-03-20 NOTE — ED Triage Notes (Signed)
Pt reports blister to right ankle x 3 weeks that has become worse. Pt reports seeing pus yesterday.  Pt denies injury, reports history of diabetes.

## 2019-07-27 ENCOUNTER — Other Ambulatory Visit: Payer: Self-pay

## 2019-07-27 ENCOUNTER — Emergency Department (HOSPITAL_BASED_OUTPATIENT_CLINIC_OR_DEPARTMENT_OTHER)
Admission: EM | Admit: 2019-07-27 | Discharge: 2019-07-27 | Disposition: A | Discharge status: Home / Self Care | Payer: Medicaid Other | Attending: Emergency Medicine | Admitting: Emergency Medicine

## 2019-07-27 ENCOUNTER — Encounter (HOSPITAL_BASED_OUTPATIENT_CLINIC_OR_DEPARTMENT_OTHER): Payer: Self-pay

## 2019-07-27 ENCOUNTER — Emergency Department (HOSPITAL_BASED_OUTPATIENT_CLINIC_OR_DEPARTMENT_OTHER): Payer: Medicaid Other

## 2019-07-27 DIAGNOSIS — Y9389 Activity, other specified: Secondary | ICD-10-CM | POA: Diagnosis not present

## 2019-07-27 DIAGNOSIS — E119 Type 2 diabetes mellitus without complications: Secondary | ICD-10-CM | POA: Insufficient documentation

## 2019-07-27 DIAGNOSIS — S90426A Blister (nonthermal), unspecified lesser toe(s), initial encounter: Secondary | ICD-10-CM

## 2019-07-27 DIAGNOSIS — I10 Essential (primary) hypertension: Secondary | ICD-10-CM | POA: Diagnosis not present

## 2019-07-27 DIAGNOSIS — Z79899 Other long term (current) drug therapy: Secondary | ICD-10-CM | POA: Diagnosis not present

## 2019-07-27 DIAGNOSIS — Y999 Unspecified external cause status: Secondary | ICD-10-CM | POA: Insufficient documentation

## 2019-07-27 DIAGNOSIS — Z7984 Long term (current) use of oral hypoglycemic drugs: Secondary | ICD-10-CM | POA: Diagnosis not present

## 2019-07-27 DIAGNOSIS — Y9289 Other specified places as the place of occurrence of the external cause: Secondary | ICD-10-CM | POA: Diagnosis not present

## 2019-07-27 DIAGNOSIS — X58XXXA Exposure to other specified factors, initial encounter: Secondary | ICD-10-CM | POA: Insufficient documentation

## 2019-07-27 DIAGNOSIS — Z87891 Personal history of nicotine dependence: Secondary | ICD-10-CM | POA: Diagnosis not present

## 2019-07-27 DIAGNOSIS — S90424A Blister (nonthermal), right lesser toe(s), initial encounter: Secondary | ICD-10-CM | POA: Insufficient documentation

## 2019-07-27 MED ORDER — AMOXICILLIN-POT CLAVULANATE 500-125 MG PO TABS: 1.0000 | ORAL_TABLET | Freq: Three times a day (TID) | ORAL | 0 refills | Status: DC

## 2019-07-27 NOTE — ED Triage Notes (Signed)
Pt c/o blisters to right toes since 4/15-denies injury-NAD-steady gait

## 2019-07-27 NOTE — ED Provider Notes (Signed)
[facility] EMERGENCY DEPARTMENT Provider Note   CSN: [id number] Arrival date & time: 07/27/19  1142     History Chief Complaint  Patient presents with  . Blister    Danielle Mendez is a 44 y.o. female.  Patient is a 44 year old female with past medical history of diabetes, GERD, hypertension.  She presents today for evaluation of swelling and blisters to her toes of the right foot.  This began in the absence of any injury or trauma.  Blisters are leaking clear yellow fluid.  Patient denies any fevers or chills.  She denies any burn, injury, or trauma.  The history is provided by the patient.       Past Medical History:  Diagnosis Date  . Diabetes mellitus without complication ([place])   . GERD (gastroesophageal reflux disease)   . Hypercholesteremia   . Hypertension   . Uterine fibroid     Patient Active Problem List   Diagnosis Date Noted  . Type II or unspecified type diabetes mellitus without mention of complication, uncontrolled 04/19/2013  . HTN (hypertension) 04/19/2013  . Hyperlipidemia 04/19/2013  . Headache(784.0) 04/19/2013  . Menorrhagia 04/19/2013  . Pelvic pain 04/19/2013    History reviewed. No pertinent surgical history.   OB History   No obstetric history on file.     No family history on file.  Social History   Tobacco Use  . Smoking status: Former Smoker    Quit date: 04/14/2013    Years since quitting: 6.2  . Smokeless tobacco: Never Used  Substance Use Topics  . Alcohol use: No  . Drug use: No    Home Medications Prior to Admission medications   Medication Sig Start Date End Date Taking? Authorizing Provider  metFORMIN (GLUCOPHAGE-XR) 500 MG 24 hr tablet Take 1,000 mg by mouth daily with breakfast.   Yes [provider]  amLODipine (NORVASC) 10 MG tablet Take 10 mg by mouth daily.    [provider]  atorvastatin (LIPITOR) 10 MG tablet Take 10 mg by mouth daily.    [provider]  Fe  Fum-FePoly-Vit C-Vit B3 (INTEGRA) 62.5-62.5-40-3 MG CAPS Take one daily  Give generic [id number]   Delora Fuel, MD  hydrochlorothiazide (HYDRODIURIL) 25 MG tablet Take 1 tablet (25 mg total) by mouth daily. 04/11/15   Horton, Barbette Hair, MD  ibuprofen (ADVIL,MOTRIN) 800 MG tablet Take 1 tablet (800 mg total) by mouth 3 (three) times daily. 08/24/13   Palumbo, April, MD  Liraglutide (VICTOZA [place]) Inject into the skin.    [provider]  loperamide (IMODIUM) 2 MG capsule Take 1 capsule (2 mg total) by mouth 4 (four) times daily as needed for diarrhea or loose stools. 08/13/17   Ward, Delice Bison, DO  sitaGLIPtin (JANUVIA) 100 MG tablet Take by mouth. 10/27/18   [provider]    Allergies    Bactrim [sulfamethoxazole-trimethoprim]  Review of Systems   Review of Systems  All other systems reviewed and are negative.   Physical Exam Updated Vital Signs BP (!) 141/92 (BP Location: Left Arm)   Pulse 99   Temp 98.1 F (36.7 C) (Oral)   Resp 18   Ht 5\' 7"  (1.702 m)   Wt 96.3 kg   LMP 06/26/2019   SpO2 100%   BMI 33.25 kg/m   Physical Exam Vitals and nursing note reviewed.  Constitutional:      General: She is not in acute distress.    Appearance: Normal appearance. She is not ill-appearing.  HENT:     Head: Normocephalic and atraumatic.  Pulmonary:     Effort: Pulmonary effort is normal.  Skin:    General: Skin is warm and dry.     Comments: The dorsal aspect of the second, third, fourth, and fifth toes has blistering present.  There is underlying granulation tissue, but no significant erythema or warmth extending into the foot or leg.  Neurological:     Mental Status: She is alert and oriented to person, place, and time.       ED Results / Procedures / Treatments   Labs (all labs ordered are listed, but only abnormal results are displayed) Labs Reviewed - No data to display  EKG None  Radiology No results found.  Procedures Procedures (including critical  care time)  Medications Ordered in ED Medications - No data to display  ED Course  I have reviewed the triage vital signs and the nursing notes.  Pertinent labs & imaging results that were available during my care of the patient were reviewed by me and considered in my medical decision making (see chart for details).    MDM Rules/Calculators/A&P  Patient presenting here with complaints of blistering to the toes of her right foot as described in the HPI and pictured in the physical exam.  I am uncertain as to the etiology of this blistering, however an infectious etiology is considered.  Her x-rays show no evidence for osteomyelitis.  The toes appear well perfused and sensation is intact.  Patient will be treated with Augmentin, local wound care, and as needed return.  Final Clinical Impression(s) / ED Diagnoses Final diagnoses:  None    Rx / DC Orders ED Discharge Orders    None       Veryl Speak, MD 07/27/19 1422

## 2019-07-27 NOTE — Discharge Instructions (Signed)
Begin taking Augmentin as prescribed.  Local wound care with bacitracin and dressing changes twice daily.  Follow-up with your primary doctor for a recheck in the next few days, and return to the ER if you develop increased pain, increased redness, high fever, or other new and concerning symptoms.

## 2020-03-15 ENCOUNTER — Encounter (HOSPITAL_BASED_OUTPATIENT_CLINIC_OR_DEPARTMENT_OTHER): Payer: Self-pay | Admitting: *Deleted

## 2020-03-15 ENCOUNTER — Other Ambulatory Visit: Payer: Self-pay

## 2020-03-15 DIAGNOSIS — Z87891 Personal history of nicotine dependence: Secondary | ICD-10-CM | POA: Diagnosis not present

## 2020-03-15 DIAGNOSIS — Z79899 Other long term (current) drug therapy: Secondary | ICD-10-CM | POA: Diagnosis not present

## 2020-03-15 DIAGNOSIS — E119 Type 2 diabetes mellitus without complications: Secondary | ICD-10-CM | POA: Insufficient documentation

## 2020-03-15 DIAGNOSIS — M545 Low back pain, unspecified: Secondary | ICD-10-CM | POA: Insufficient documentation

## 2020-03-15 DIAGNOSIS — I10 Essential (primary) hypertension: Secondary | ICD-10-CM | POA: Insufficient documentation

## 2020-03-15 DIAGNOSIS — Z7984 Long term (current) use of oral hypoglycemic drugs: Secondary | ICD-10-CM | POA: Insufficient documentation

## 2020-03-15 LAB — URINALYSIS, MICROSCOPIC (REFLEX): Bacteria, UA: NONE SEEN

## 2020-03-15 LAB — URINALYSIS, ROUTINE W REFLEX MICROSCOPIC
Bilirubin Urine: NEGATIVE
Glucose, UA: >=500 mg/dL — AB
Hgb urine dipstick: NEGATIVE
Ketones, ur: NEGATIVE mg/dL
Leukocytes,Ua: NEGATIVE
Nitrite: NEGATIVE
Protein, ur: NEGATIVE mg/dL
Specific Gravity, Urine: 1.01 (ref 1.005–1.030)
pH: 5.5 (ref 5.0–8.0)

## 2020-03-15 LAB — PREGNANCY, URINE: Preg Test, Ur: NEGATIVE

## 2020-03-15 NOTE — ED Triage Notes (Signed)
Back pain x 3 days. She works in patient care.

## 2020-03-16 ENCOUNTER — Emergency Department (HOSPITAL_BASED_OUTPATIENT_CLINIC_OR_DEPARTMENT_OTHER)
Admission: EM | Admit: 2020-03-16 | Discharge: 2020-03-16 | Disposition: A | Discharge status: Home / Self Care | Payer: No Typology Code available for payment source | Attending: Emergency Medicine | Admitting: Emergency Medicine

## 2020-03-16 DIAGNOSIS — M545 Low back pain, unspecified: Secondary | ICD-10-CM

## 2020-03-16 MED ORDER — CYCLOBENZAPRINE HCL 10 MG PO TABS
10.0000 mg | ORAL_TABLET | Freq: Once | ORAL | Status: AC
  Administered 2020-03-16: 10 mg via ORAL
  Filled 2020-03-16: qty 1

## 2020-03-16 MED ORDER — CYCLOBENZAPRINE HCL 10 MG PO TABS: 10.0000 mg | ORAL_TABLET | Freq: Three times a day (TID) | ORAL | 0 refills | Status: AC | PRN

## 2020-03-16 MED ORDER — NAPROXEN 375 MG PO TABS: ORAL_TABLET | ORAL | 0 refills | Status: AC

## 2020-03-16 MED ORDER — NAPROXEN 250 MG PO TABS
500.0000 mg | ORAL_TABLET | Freq: Once | ORAL | Status: AC
  Administered 2020-03-16: 500 mg via ORAL
  Filled 2020-03-16: qty 2

## 2020-03-16 NOTE — ED Provider Notes (Signed)
[facility] Provider Note: Danielle Spurling, MD, FACEP  CSN: [id number] MRN: [id number] ARRIVAL: 03/15/20 at [place]  03/16/20 2:36 AM Danielle Mendez is a 44 y.o. female works in [employer].  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  Her work has been short on staff and she has had to do a lot of work by herself.  She is now having low back pain for 3 days.  It is in the bilateral lumbar region.  It is aching in nature.  She rates it as a 9 out of 10, worse with movement of her lower back.  It does not radiate to her legs.  She has no new numbness or weakness.  She has not taken anything for it but has applied an unspecified cream without relief.   Past Medical History:  Diagnosis Date  . Diabetes mellitus without complication ([place])   . GERD (gastroesophageal reflux disease)   . Hypercholesteremia   . Hypertension   . Uterine fibroid     History reviewed. No pertinent surgical history.  No family history on file.  Social History   Tobacco Use  . Smoking status: Former Smoker    Quit date: 04/14/2013    Years since quitting: 6.9  . Smokeless tobacco: Never Used  Vaping Use  . Vaping Use: Never used  Substance Use Topics  . Alcohol use: No  . Drug use: No    Prior to Admission medications   Medication Sig Start Date End Date Taking? Authorizing Provider  amLODipine (NORVASC) 10 MG tablet Take 10 mg by mouth daily.    [provider]  atorvastatin (LIPITOR) 10 MG tablet Take 10 mg by mouth daily.    [provider]  cyclobenzaprine (FLEXERIL) 10 MG tablet Take 1 tablet (10 mg total) by mouth 3 (three) times daily as needed for muscle spasms. 03/16/20   Marijean Montanye, Jenny Reichmann, MD  Fe Fum-FePoly-Vit C-Vit B3 [facility]) 62.5-62.5-40-3 MG CAPS Take one daily  Give generic 62/2/29   Delora Fuel, MD  hydrochlorothiazide (HYDRODIURIL) 25 MG tablet Take 1 tablet (25 mg  total) by mouth daily. 04/11/15   Horton, Barbette Hair, MD  Liraglutide (VICTOZA [place]) Inject into the skin.    [provider]  loperamide (IMODIUM) 2 MG capsule Take 1 capsule (2 mg total) by mouth 4 (four) times daily as needed for diarrhea or loose stools. 08/13/17   Ward, Delice Bison, DO  metFORMIN (GLUCOPHAGE-XR) 500 MG 24 hr tablet Take 1,000 mg by mouth daily with breakfast.    [provider]  naproxen (NAPROSYN) 375 MG tablet Take 1 tablet twice daily as needed for back pain. 03/16/20   Truong Delcastillo, MD  sitaGLIPtin (JANUVIA) 100 MG tablet Take by mouth. 10/27/18   [provider]    Allergies Bactrim [sulfamethoxazole-trimethoprim]   REVIEW OF SYSTEMS  Negative except as noted here or in the History of Present Illness.   PHYSICAL EXAMINATION  Initial Vital Signs Blood pressure 117/71, pulse 98, temperature 98 F (36.7 C), temperature source Oral, resp. rate 20, height 5\' 7"  (1.702 m), weight 96.3 kg, last menstrual period 02/14/2020, SpO2 100 %.  Examination General: Well-developed, well-nourished female in no acute distress; appearance consistent with age of record HENT: normocephalic; atraumatic Eyes: pupils equal, round and reactive to light; extraocular muscles intact Neck: supple Heart: regular rate and rhythm Lungs: clear to auscultation bilaterally Abdomen: soft;  nondistended; nontender; bowel sounds present Back: Bilateral paralumbar tenderness; negative straight leg raise bilaterally Extremities: No deformity; full range of motion; pulses normal Neurologic: Awake, alert and oriented; motor function intact in all extremities and symmetric; no facial droop Skin: Warm and dry Psychiatric: Normal mood and affect   RESULTS  Summary of this visit's results, reviewed and interpreted by myself:   EKG Interpretation  Date/Time:    Ventricular Rate:    PR Interval:    QRS Duration:   QT Interval:    QTC Calculation:   R Axis:     Text  Interpretation:        Laboratory Studies: Results for orders placed or performed during the hospital encounter of 03/16/20 (from the past 24 hour(s))  Pregnancy, urine     Status: None   Collection Time: 03/15/20 11:03 PM  Result Value Ref Range   Preg Test, Ur NEGATIVE NEGATIVE  Urinalysis, Routine w reflex microscopic Urine, Clean Catch     Status: Abnormal   Collection Time: 03/15/20 11:03 PM  Result Value Ref Range   Color, Urine YELLOW YELLOW   APPearance CLEAR CLEAR   Specific Gravity, Urine 1.010 1.005 - 1.030   pH 5.5 5.0 - 8.0   Glucose, UA >=500 (A) NEGATIVE mg/dL   Hgb urine dipstick NEGATIVE NEGATIVE   Bilirubin Urine NEGATIVE NEGATIVE   Ketones, ur NEGATIVE NEGATIVE mg/dL   Protein, ur NEGATIVE NEGATIVE mg/dL   Nitrite NEGATIVE NEGATIVE   Leukocytes,Ua NEGATIVE NEGATIVE  Urinalysis, Microscopic (reflex)     Status: None   Collection Time: 03/15/20 11:03 PM  Result Value Ref Range   RBC / HPF 0-5 0 - 5 RBC/hpf   WBC, UA 0-5 0 - 5 WBC/hpf   Bacteria, UA NONE SEEN NONE SEEN   Squamous Epithelial / LPF 0-5 0 - 5   Imaging Studies: No results found.  ED COURSE and MDM  Nursing notes, initial and subsequent vitals signs, including pulse oximetry, reviewed and interpreted by myself.  Vitals:   03/15/20 2233 03/15/20 2236  BP:  117/71  Pulse:  98  Resp:  20  Temp:  98 F (36.7 C)  TempSrc:  Oral  SpO2:  100%  Weight: 96.3 kg   Height: 5\' 7"  (1.702 m)    Medications  naproxen (NAPROSYN) tablet 500 mg (has no administration in time range)  cyclobenzaprine (FLEXERIL) tablet 10 mg (has no administration in time range)    I do not believe radiographs are indicated at this time as she has not had high impact injury.  We will treat with an NSAID and muscle relaxant.  PROCEDURES  Procedures   ED DIAGNOSES     ICD-10-CM   1. Acute bilateral low back pain without sciatica  M54.50        Yariel Ferraris, Jenny Reichmann, MD 03/16/20 0246
